# Patient Record
Sex: Female | Born: 1950 | Race: White | Hispanic: No
Health system: Southern US, Community
[De-identification: ages and names within clinical notes are randomized; demographics above are authoritative.]

---

## 1999-12-05 ENCOUNTER — Other Ambulatory Visit: Admission: RE | Admit: 1999-12-05 | Discharge: 1999-12-05 | Payer: Self-pay | Admitting: Gynecology

## 2000-02-28 ENCOUNTER — Other Ambulatory Visit: Admission: RE | Admit: 2000-02-28 | Discharge: 2000-02-28 | Payer: Self-pay | Admitting: Gynecology

## 2000-03-27 ENCOUNTER — Other Ambulatory Visit: Admission: RE | Admit: 2000-03-27 | Discharge: 2000-03-27 | Payer: Self-pay | Admitting: Gynecology

## 2000-08-29 ENCOUNTER — Ambulatory Visit (HOSPITAL_COMMUNITY): Admission: RE | Admit: 2000-08-29 | Discharge: 2000-08-29 | Payer: Self-pay | Admitting: Gastroenterology

## 2000-10-23 ENCOUNTER — Encounter: Admission: RE | Admit: 2000-10-23 | Discharge: 2000-10-23 | Payer: Self-pay | Admitting: Gynecology

## 2000-10-23 ENCOUNTER — Encounter: Payer: Self-pay | Admitting: Gynecology

## 2001-01-07 ENCOUNTER — Other Ambulatory Visit: Admission: RE | Admit: 2001-01-07 | Discharge: 2001-01-07 | Payer: Self-pay | Admitting: Gynecology

## 2001-01-15 ENCOUNTER — Encounter: Payer: Self-pay | Admitting: Gynecology

## 2001-01-15 ENCOUNTER — Encounter: Admission: RE | Admit: 2001-01-15 | Discharge: 2001-01-15 | Payer: Self-pay | Admitting: Gynecology

## 2007-07-23 ENCOUNTER — Other Ambulatory Visit: Payer: Self-pay

## 2007-07-23 ENCOUNTER — Inpatient Hospital Stay: Payer: Self-pay | Admitting: Internal Medicine

## 2007-09-23 ENCOUNTER — Emergency Department: Payer: Self-pay | Admitting: Emergency Medicine

## 2007-09-23 ENCOUNTER — Other Ambulatory Visit: Payer: Self-pay

## 2007-12-17 ENCOUNTER — Emergency Department: Payer: Self-pay | Admitting: Emergency Medicine

## 2008-02-10 ENCOUNTER — Inpatient Hospital Stay: Payer: Self-pay | Admitting: Internal Medicine

## 2008-02-10 ENCOUNTER — Other Ambulatory Visit: Payer: Self-pay

## 2008-04-10 ENCOUNTER — Other Ambulatory Visit: Payer: Self-pay

## 2008-04-10 ENCOUNTER — Emergency Department: Payer: Self-pay | Admitting: Emergency Medicine

## 2008-04-30 ENCOUNTER — Other Ambulatory Visit: Payer: Self-pay

## 2008-04-30 ENCOUNTER — Emergency Department: Payer: Self-pay | Admitting: Emergency Medicine

## 2008-05-30 ENCOUNTER — Other Ambulatory Visit: Payer: Self-pay

## 2008-05-30 ENCOUNTER — Inpatient Hospital Stay: Payer: Self-pay | Admitting: Internal Medicine

## 2008-07-20 ENCOUNTER — Emergency Department: Payer: Self-pay | Admitting: Emergency Medicine

## 2008-08-13 ENCOUNTER — Inpatient Hospital Stay: Payer: Self-pay | Admitting: Internal Medicine

## 2010-03-12 ENCOUNTER — Inpatient Hospital Stay: Payer: Self-pay | Admitting: Internal Medicine

## 2010-07-05 ENCOUNTER — Inpatient Hospital Stay: Payer: Self-pay | Admitting: Specialist

## 2010-07-16 ENCOUNTER — Emergency Department: Payer: Self-pay | Admitting: Emergency Medicine

## 2011-02-11 ENCOUNTER — Inpatient Hospital Stay: Payer: Self-pay | Admitting: Internal Medicine

## 2011-04-25 ENCOUNTER — Emergency Department: Payer: Self-pay | Admitting: Emergency Medicine

## 2011-04-26 ENCOUNTER — Inpatient Hospital Stay (HOSPITAL_COMMUNITY): Payer: Non-veteran care

## 2011-04-26 ENCOUNTER — Inpatient Hospital Stay (HOSPITAL_COMMUNITY)
Admission: AD | Admit: 2011-04-26 | Discharge: 2011-05-02 | DRG: 917 | Disposition: A | Discharge status: Home / Self Care | Payer: Non-veteran care | Source: Other Acute Inpatient Hospital | Attending: Internal Medicine | Admitting: Internal Medicine

## 2011-04-26 DIAGNOSIS — J9600 Acute respiratory failure, unspecified whether with hypoxia or hypercapnia: Secondary | ICD-10-CM

## 2011-04-26 DIAGNOSIS — T4271XA Poisoning by unspecified antiepileptic and sedative-hypnotic drugs, accidental (unintentional), initial encounter: Principal | ICD-10-CM | POA: Diagnosis present

## 2011-04-26 DIAGNOSIS — Z8673 Personal history of transient ischemic attack (TIA), and cerebral infarction without residual deficits: Secondary | ICD-10-CM

## 2011-04-26 DIAGNOSIS — Z7982 Long term (current) use of aspirin: Secondary | ICD-10-CM

## 2011-04-26 DIAGNOSIS — F1120 Opioid dependence, uncomplicated: Secondary | ICD-10-CM | POA: Diagnosis present

## 2011-04-26 DIAGNOSIS — J449 Chronic obstructive pulmonary disease, unspecified: Secondary | ICD-10-CM

## 2011-04-26 DIAGNOSIS — Z9861 Coronary angioplasty status: Secondary | ICD-10-CM

## 2011-04-26 DIAGNOSIS — E876 Hypokalemia: Secondary | ICD-10-CM | POA: Diagnosis present

## 2011-04-26 DIAGNOSIS — T424X4A Poisoning by benzodiazepines, undetermined, initial encounter: Secondary | ICD-10-CM | POA: Diagnosis present

## 2011-04-26 DIAGNOSIS — G9340 Encephalopathy, unspecified: Secondary | ICD-10-CM | POA: Diagnosis present

## 2011-04-26 DIAGNOSIS — J4489 Other specified chronic obstructive pulmonary disease: Secondary | ICD-10-CM | POA: Diagnosis present

## 2011-04-26 DIAGNOSIS — I10 Essential (primary) hypertension: Secondary | ICD-10-CM | POA: Diagnosis present

## 2011-04-26 DIAGNOSIS — J189 Pneumonia, unspecified organism: Secondary | ICD-10-CM | POA: Diagnosis present

## 2011-04-26 DIAGNOSIS — F1910 Other psychoactive substance abuse, uncomplicated: Secondary | ICD-10-CM | POA: Diagnosis present

## 2011-04-26 DIAGNOSIS — I2510 Atherosclerotic heart disease of native coronary artery without angina pectoris: Secondary | ICD-10-CM | POA: Diagnosis present

## 2011-04-26 DIAGNOSIS — E785 Hyperlipidemia, unspecified: Secondary | ICD-10-CM | POA: Diagnosis present

## 2011-04-26 DIAGNOSIS — T400X1A Poisoning by opium, accidental (unintentional), initial encounter: Secondary | ICD-10-CM

## 2011-04-26 DIAGNOSIS — E119 Type 2 diabetes mellitus without complications: Secondary | ICD-10-CM | POA: Diagnosis present

## 2011-04-26 DIAGNOSIS — T424X1A Poisoning by benzodiazepines, accidental (unintentional), initial encounter: Secondary | ICD-10-CM | POA: Diagnosis present

## 2011-04-26 DIAGNOSIS — G4700 Insomnia, unspecified: Secondary | ICD-10-CM | POA: Diagnosis present

## 2011-04-26 LAB — POCT I-STAT 3, ART BLOOD GAS (G3+)
Acid-Base Excess: 8 mmol/L — ABNORMAL HIGH (ref 0.0–2.0)
Bicarbonate: 34.5 mEq/L — ABNORMAL HIGH (ref 20.0–24.0)
O2 Saturation: 92 %
Patient temperature: 97.4
TCO2: 36 mmol/L (ref 0–100)
pCO2 arterial: 56 mmHg — ABNORMAL HIGH (ref 35.0–45.0)
pH, Arterial: 7.394 (ref 7.350–7.400)
pO2, Arterial: 64 mmHg — ABNORMAL LOW (ref 80.0–100.0)

## 2011-04-26 LAB — GLUCOSE, CAPILLARY
Glucose-Capillary: 121 mg/dL — ABNORMAL HIGH (ref 70–99)
Glucose-Capillary: 124 mg/dL — ABNORMAL HIGH (ref 70–99)
Glucose-Capillary: 120 mg/dL — ABNORMAL HIGH (ref 70–99)

## 2011-04-26 LAB — BASIC METABOLIC PANEL
GFR calc non Af Amer: >60 mL/min (ref >60)
Glucose, Bld: 127 mg/dL — ABNORMAL HIGH (ref 70–99)
Potassium: 2.9 mEq/L — ABNORMAL LOW (ref 3.5–5.1)
Sodium: 139 mEq/L (ref 135–145)

## 2011-04-26 LAB — MRSA PCR SCREENING: MRSA by PCR: NEGATIVE

## 2011-04-27 ENCOUNTER — Inpatient Hospital Stay (HOSPITAL_COMMUNITY): Payer: Non-veteran care

## 2011-04-27 LAB — CBC
Hemoglobin: 11.4 g/dL — ABNORMAL LOW (ref 12.0–15.0)
MCH: 28.5 pg (ref 26.0–34.0)
Platelets: 133 10*3/uL — ABNORMAL LOW (ref 150–400)
RBC: 4 MIL/uL (ref 3.87–5.11)
WBC: 8.1 10*3/uL (ref 4.0–10.5)

## 2011-04-27 LAB — BASIC METABOLIC PANEL
CO2: 32 mEq/L (ref 19–32)
Chloride: 102 mEq/L (ref 96–112)
Glucose, Bld: 131 mg/dL — ABNORMAL HIGH (ref 70–99)
Potassium: 3.8 mEq/L (ref 3.5–5.1)
Sodium: 139 mEq/L (ref 135–145)

## 2011-04-27 LAB — GLUCOSE, CAPILLARY
Glucose-Capillary: 136 mg/dL — ABNORMAL HIGH (ref 70–99)
Glucose-Capillary: 96 mg/dL (ref 70–99)

## 2011-04-27 LAB — MAGNESIUM: Magnesium: 1.6 mg/dL (ref 1.5–2.5)

## 2011-04-27 LAB — PHOSPHORUS: Phosphorus: 2.2 mg/dL — ABNORMAL LOW (ref 2.3–4.6)

## 2011-04-28 ENCOUNTER — Inpatient Hospital Stay (HOSPITAL_COMMUNITY): Payer: Non-veteran care

## 2011-04-28 LAB — CBC
HCT: 34.5 % — ABNORMAL LOW (ref 36.0–46.0)
Hemoglobin: 11.3 g/dL — ABNORMAL LOW (ref 12.0–15.0)
MCH: 28.7 pg (ref 26.0–34.0)
MCHC: 32.8 g/dL (ref 30.0–36.0)
RBC: 3.94 MIL/uL (ref 3.87–5.11)

## 2011-04-28 LAB — BASIC METABOLIC PANEL
BUN: 5 mg/dL — ABNORMAL LOW (ref 6–23)
CO2: 35 mEq/L — ABNORMAL HIGH (ref 19–32)
Calcium: 8.5 mg/dL (ref 8.4–10.5)
GFR calc non Af Amer: >60 mL/min (ref >60)
Glucose, Bld: 111 mg/dL — ABNORMAL HIGH (ref 70–99)
Potassium: 3.1 mEq/L — ABNORMAL LOW (ref 3.5–5.1)
Sodium: 140 mEq/L (ref 135–145)

## 2011-04-28 LAB — GLUCOSE, CAPILLARY: Glucose-Capillary: 111 mg/dL — ABNORMAL HIGH (ref 70–99)

## 2011-04-28 LAB — LACTIC ACID, PLASMA: Lactic Acid, Venous: 1.4 mmol/L (ref 0.5–2.2)

## 2011-04-29 LAB — GLUCOSE, CAPILLARY
Glucose-Capillary: 125 mg/dL — ABNORMAL HIGH (ref 70–99)
Glucose-Capillary: 88 mg/dL (ref 70–99)

## 2011-04-29 LAB — CBC
MCV: 89 fL (ref 78.0–100.0)
Platelets: 144 10*3/uL — ABNORMAL LOW (ref 150–400)
RBC: 3.83 MIL/uL — ABNORMAL LOW (ref 3.87–5.11)
RDW: 14.7 % (ref 11.5–15.5)
WBC: 5 10*3/uL (ref 4.0–10.5)

## 2011-04-29 LAB — PHOSPHORUS: Phosphorus: 2.7 mg/dL (ref 2.3–4.6)

## 2011-04-29 LAB — BASIC METABOLIC PANEL
CO2: 36 mEq/L — ABNORMAL HIGH (ref 19–32)
Chloride: 99 mEq/L (ref 96–112)
Creatinine, Ser: 0.6 mg/dL (ref 0.50–1.10)
GFR calc Af Amer: >60 mL/min (ref >60)
Potassium: 3.4 mEq/L — ABNORMAL LOW (ref 3.5–5.1)

## 2011-04-30 DIAGNOSIS — F322 Major depressive disorder, single episode, severe without psychotic features: Secondary | ICD-10-CM

## 2011-04-30 LAB — BASIC METABOLIC PANEL
BUN: 8 mg/dL (ref 6–23)
CO2: 38 mEq/L — ABNORMAL HIGH (ref 19–32)
Calcium: 9.1 mg/dL (ref 8.4–10.5)
Creatinine, Ser: 0.72 mg/dL (ref 0.50–1.10)
GFR calc non Af Amer: >60 mL/min (ref >60)
Glucose, Bld: 85 mg/dL (ref 70–99)
Sodium: 141 mEq/L (ref 135–145)

## 2011-04-30 LAB — CBC
HCT: 34.3 % — ABNORMAL LOW (ref 36.0–46.0)
Hemoglobin: 11.1 g/dL — ABNORMAL LOW (ref 12.0–15.0)
MCH: 28.8 pg (ref 26.0–34.0)
MCHC: 32.4 g/dL (ref 30.0–36.0)
MCV: 88.9 fL (ref 78.0–100.0)
RBC: 3.86 MIL/uL — ABNORMAL LOW (ref 3.87–5.11)

## 2011-05-01 LAB — CBC
MCHC: 32.6 g/dL (ref 30.0–36.0)
Platelets: 169 10*3/uL (ref 150–400)
RDW: 14.5 % (ref 11.5–15.5)
WBC: 5.3 10*3/uL (ref 4.0–10.5)

## 2011-05-01 LAB — GLUCOSE, CAPILLARY
Glucose-Capillary: 166 mg/dL — ABNORMAL HIGH (ref 70–99)
Glucose-Capillary: 108 mg/dL — ABNORMAL HIGH (ref 70–99)
Glucose-Capillary: 109 mg/dL — ABNORMAL HIGH (ref 70–99)

## 2011-05-01 LAB — BASIC METABOLIC PANEL
BUN: 16 mg/dL (ref 6–23)
Chloride: 98 mEq/L (ref 96–112)
GFR calc Af Amer: >60 mL/min (ref >60)
GFR calc non Af Amer: 60 mL/min — ABNORMAL LOW (ref >60)
Potassium: 3.7 mEq/L (ref 3.5–5.1)

## 2011-05-01 LAB — MAGNESIUM: Magnesium: 1.7 mg/dL (ref 1.5–2.5)

## 2011-05-03 NOTE — Discharge Summary (Signed)
  [name], [name]                ACCOUNT NO.:  [id number]  MEDICAL RECORD NO.:  [id number]  LOCATION:  3712                         FACILITY:  [facility]  PHYSICIAN:  Conley Canal, MD      DATE OF BIRTH:  02-27-1951  DATE OF ADMISSION:  04/26/2011 DATE OF DISCHARGE:  05/02/2011                        DISCHARGE SUMMARY - REFERRING   PRIMARY CARE PHYSICIAN:  [place].  CONSULTING PHYSICIANS: 1. Pulmonary and critical care. 2. Dr. Eulogio Ditch, psychiatrist.  DISCHARGE DIAGNOSES: 1. Acute on chronic respiratory failure in patient with chronic     obstructive pulmonary disease requiring mechanical ventilation,     acute respiratory failure secondary to overdose of narcotics and     benzodiazepines. 2. Malignant hypertension. 3. Hyperlipidemia. 4. Left lower lobe pneumonia. 5. coronary artery disease history, unclear.  DISCHARGE MEDICATIONS: 1. Amlodipine 5 mg daily. 2. Advair 250/50 one puffs twice daily. 3. Robitussin 1200 mg twice daily. 4. Prednisone taper. 5. Combivent inhalations every 4 hours as needed. 6. Aspirin 81 mg daily. 7. Colace 100 mg twice daily. 8. Oxycodone 5 mg every 6 hours as needed.  No new prescription given. 9. Temazepam 30 mg at bedtime.  No new prescription given. 10.Triamterene HCTZ 37.5- 25 mg daily.  PROCEDURES PERFORMED:  Chest x-rays on August 1, 2, and 3 showing emphysematous and bronchitic changes with evidence of old granulomatous disease, minimal atelectasis versus infiltrate at left lung base.  HOSPITAL COURSE:  Ms. [name] was admitted on August 1 as a transfer from [facility] with acute respiratory failure, presumed to be secondary to narcotic overdose.  The patient apparently admitted to taking a handful of pills before becoming unresponsive and hypotensive and at admission the patient was found to have pinpoint pupils.  She was intubated for airway protection and required a Narcan drip.  The patient was initially  managed under the critical care service eventually, successfully extubated and she has been doing well since coming off the ventilator.  The patient was seen by Psychiatry regarding the overdose and psychiatry felt that the patient was not suicidal.  The patient has been on narcotics for a long time and she is now dependent and cannot do without the narcotics.  Different options were offered by Psychiatry including detoxification, which the patient was not interested in.  I did not give a new prescription during this admission.  She has done well.  The patient completed course of antibiotics with Avelox for COPD and left lower lobe pneumonia.  Today her labs include a CBC showing white count of 5.3, hemoglobin 11.6, hematocrit 35.6, platelet count 169, sodium 141, potassium 3.7, BUN 16, creatinine 0.96, bicarb 37, calcium 9.4, magnesium 1.7.  She should follow with [facility].  She is being discharged to the care of supportive family including her mother.     Conley Canal, MD     SR/MEDQ  D:  05/02/2011  T:  05/02/2011  Job:  [id number]  Electronically Signed by Conley Canal  on 05/03/2011 10:12:02 PM

## 2011-05-04 NOTE — Consult Note (Signed)
[name], [name]                ACCOUNT NO.:  [id number]  MEDICAL RECORD NO.:  [id number]  LOCATION:  2112                         FACILITY:  [facility]  PHYSICIAN:  Eulogio Ditch, MD DATE OF BIRTH:  [birth year]  DATE OF CONSULTATION:  04/26/2011 DATE OF DISCHARGE:                                CONSULTATION   REASON FOR CONSULTATION:  Questionable overdose on the pain medications.  HISTORY OF PRESENT ILLNESS:  A 60 year old Caucasian female who is admitted in the ICU at [facility] after the narcotic overdose.  The patient told me that she takes oxycodone 2 tablets 5 mg he q.4 h. and she take one sleeping pill temazepam at bedtime and her mother controls that.  She denied that she was trying to kill herself.  She denied any past suicide attempt or being admitted in Psychiatry.  The patient is not followed by psychiatrist and she denied seen by a psychiatrist in the past.  The patient told me that she takes pain medication for arthritis and she go to the [facility].  I spoke with the son who told me that the mother has went through a lot of bad relationships and she was introduced on the pain medication by her friend last year and after that she started using pain medication and he thinks the mother is overusing this medication and she goes to the doctor and lies about her pain to get more pain medication, but he denies that the mother was trying to kill herself.  He denied that any time recently that she verbalized any suicidal ideations.  The patient is calm and cooperative during the interview.  Fair eye contact.  No abnormal movements noticed.  The patient is logical and goal directed.  Not delusional, not psychotic, not internally preoccupied.  The patient is alert, awake, oriented x3.  Memory immediate, recent remote fair.  Attention and concentration fair. Abstraction ability fair.  Insight and judgment fair.  DIAGNOSES:  AXIS I:  Opioids dependence. AXIS II:   Deferred. AXIS III:  See medical notes. AXIS IV:  Questionable substance abuse. AXIS V:  50.  RECOMMENDATIONS: 1. I discussed with the patient different options one of them was to     get detox on the pain medication, but the patient told me that she     needs to be on pain medication and she do not want detox at this     time. 2. I told her that the son was talking about her going through bad     relationship so she will get benefit from counseling.  She agreed     to followup at [facility] in the outpatient setting for the     counseling.  I told the son that the clinical social worker will     call [facility] to make an outpatient psychiatric appointment. 3. At this time, sitter can be discontinued as the patient is not     suicidal. 4. I will follow up on this patient as needed.  Thanks for involving me in taking care of this patient.     Eulogio Ditch, MD     SA/MEDQ  D:  04/26/2011  T:  04/26/2011  Job:  [id number]  Electronically Signed by Eulogio Ditch  on 05/04/2011 08:55:45 AM

## 2011-11-24 ENCOUNTER — Inpatient Hospital Stay: Payer: Self-pay | Admitting: Internal Medicine

## 2011-11-24 LAB — COMPREHENSIVE METABOLIC PANEL
Alkaline Phosphatase: 68 U/L (ref 50–136)
Calcium, Total: 9.6 mg/dL (ref 8.5–10.1)
Co2: 35 mmol/L — ABNORMAL HIGH (ref 21–32)
Creatinine: 0.84 mg/dL (ref 0.60–1.30)
EGFR (Non-African Amer.): >60
SGPT (ALT): 15 U/L

## 2011-11-24 LAB — CBC
MCV: 87 fL (ref 80–100)
Platelet: 135 10*3/uL — ABNORMAL LOW (ref 150–440)
RBC: 5.23 10*6/uL — ABNORMAL HIGH (ref 3.80–5.20)
RDW: 13.5 % (ref 11.5–14.5)
WBC: 8.9 10*3/uL (ref 3.6–11.0)

## 2011-11-25 LAB — CBC WITH DIFFERENTIAL/PLATELET
Basophil %: 0 %
Eosinophil #: 0 10*3/uL (ref 0.0–0.7)
Eosinophil %: 0 %
HGB: 13.2 g/dL (ref 12.0–16.0)
Lymphocyte #: 0.2 10*3/uL — ABNORMAL LOW (ref 1.0–3.6)
MCH: 29.3 pg (ref 26.0–34.0)
MCHC: 33.6 g/dL (ref 32.0–36.0)
MCV: 87 fL (ref 80–100)
Monocyte #: 0.1 10*3/uL (ref 0.0–0.7)
RBC: 4.52 10*6/uL (ref 3.80–5.20)

## 2011-11-25 LAB — BASIC METABOLIC PANEL
Anion Gap: 14 (ref 7–16)
BUN: 22 mg/dL — ABNORMAL HIGH (ref 7–18)
Calcium, Total: 9.4 mg/dL (ref 8.5–10.1)
EGFR (African American): >60
EGFR (Non-African Amer.): 53 — ABNORMAL LOW
Glucose: 136 mg/dL — ABNORMAL HIGH (ref 65–99)
Osmolality: 277 (ref 275–301)
Potassium: 3.5 mmol/L (ref 3.5–5.1)

## 2011-11-29 LAB — CBC WITH DIFFERENTIAL/PLATELET
Basophil #: 0 10*3/uL (ref 0.0–0.1)
Eosinophil #: 0 10*3/uL (ref 0.0–0.7)
HGB: 12 g/dL (ref 12.0–16.0)
Lymphocyte #: 0.2 10*3/uL — ABNORMAL LOW (ref 1.0–3.6)
Lymphocyte %: 2.5 %
MCH: 29.1 pg (ref 26.0–34.0)
MCHC: 32.7 g/dL (ref 32.0–36.0)
MCV: 89 fL (ref 80–100)
Neutrophil #: 8.2 10*3/uL — ABNORMAL HIGH (ref 1.4–6.5)
RBC: 4.11 10*6/uL (ref 3.80–5.20)
RDW: 15.3 % — ABNORMAL HIGH (ref 11.5–14.5)
WBC: 8.9 10*3/uL (ref 3.6–11.0)

## 2011-11-29 LAB — BASIC METABOLIC PANEL
Anion Gap: 8 (ref 7–16)
BUN: 29 mg/dL — ABNORMAL HIGH (ref 7–18)
Chloride: 92 mmol/L — ABNORMAL LOW (ref 98–107)
Co2: 37 mmol/L — ABNORMAL HIGH (ref 21–32)
Creatinine: 1.01 mg/dL (ref 0.60–1.30)
Potassium: 4.4 mmol/L (ref 3.5–5.1)
Sodium: 137 mmol/L (ref 136–145)

## 2011-12-02 LAB — CBC WITH DIFFERENTIAL/PLATELET
Basophil %: 0.1 %
Eosinophil %: 0 %
HCT: 35.9 % (ref 35.0–47.0)
HGB: 12 g/dL (ref 12.0–16.0)
MCH: 29.9 pg (ref 26.0–34.0)
Monocyte #: 0.7 10*3/uL (ref 0.0–0.7)
Neutrophil #: 11.2 10*3/uL — ABNORMAL HIGH (ref 1.4–6.5)
Neutrophil %: 91.6 %
RBC: 4.02 10*6/uL (ref 3.80–5.20)

## 2011-12-02 LAB — BASIC METABOLIC PANEL
Anion Gap: 8 (ref 7–16)
BUN: 39 mg/dL — ABNORMAL HIGH (ref 7–18)
Chloride: 88 mmol/L — ABNORMAL LOW (ref 98–107)
Co2: 38 mmol/L — ABNORMAL HIGH (ref 21–32)
Creatinine: 0.95 mg/dL (ref 0.60–1.30)
EGFR (African American): >60
Osmolality: 285 (ref 275–301)

## 2011-12-04 LAB — BASIC METABOLIC PANEL
BUN: 35 mg/dL — ABNORMAL HIGH (ref 7–18)
Chloride: 89 mmol/L — ABNORMAL LOW (ref 98–107)
Creatinine: 0.74 mg/dL (ref 0.60–1.30)
EGFR (Non-African Amer.): >60
Glucose: 206 mg/dL — ABNORMAL HIGH (ref 65–99)
Potassium: 4.3 mmol/L (ref 3.5–5.1)
Sodium: 137 mmol/L (ref 136–145)

## 2011-12-04 LAB — WBC: WBC: 13.9 10*3/uL — ABNORMAL HIGH (ref 3.6–11.0)

## 2011-12-05 LAB — WBC: WBC: 18.5 10*3/uL — ABNORMAL HIGH (ref 3.6–11.0)

## 2011-12-06 LAB — WBC: WBC: 15.5 10*3/uL — ABNORMAL HIGH (ref 3.6–11.0)

## 2011-12-16 ENCOUNTER — Emergency Department: Payer: Self-pay | Admitting: Emergency Medicine

## 2011-12-16 LAB — COMPREHENSIVE METABOLIC PANEL
Albumin: 3 g/dL — ABNORMAL LOW (ref 3.4–5.0)
Alkaline Phosphatase: 72 U/L (ref 50–136)
Anion Gap: 9 (ref 7–16)
BUN: 20 mg/dL — ABNORMAL HIGH (ref 7–18)
Bilirubin,Total: 0.3 mg/dL (ref 0.2–1.0)
Chloride: 106 mmol/L (ref 98–107)
Creatinine: 0.54 mg/dL — ABNORMAL LOW (ref 0.60–1.30)
EGFR (African American): >60
Potassium: 3.9 mmol/L (ref 3.5–5.1)
Total Protein: 6.9 g/dL (ref 6.4–8.2)

## 2011-12-16 LAB — DRUG SCREEN, URINE
Amphetamines, Ur Screen: NEGATIVE (ref ?–1000)
Barbiturates, Ur Screen: NEGATIVE (ref ?–200)
Benzodiazepine, Ur Scrn: POSITIVE (ref ?–200)
Cannabinoid 50 Ng, Ur ~~LOC~~: NEGATIVE (ref ?–50)
MDMA (Ecstasy)Ur Screen: NEGATIVE (ref ?–500)
Methadone, Ur Screen: NEGATIVE (ref ?–300)
Opiate, Ur Screen: NEGATIVE (ref ?–300)
Phencyclidine (PCP) Ur S: NEGATIVE (ref ?–25)

## 2011-12-16 LAB — URINALYSIS, COMPLETE
Bilirubin,UR: NEGATIVE
Blood: NEGATIVE
Ketone: NEGATIVE
Nitrite: NEGATIVE
Protein: NEGATIVE
RBC,UR: 2 /HPF (ref 0–5)
Squamous Epithelial: <1
WBC UR: 6 /HPF (ref 0–5)

## 2011-12-16 LAB — ETHANOL
Ethanol %: <0.003 % (ref 0.000–0.080)
Ethanol: <3 mg/dL

## 2011-12-16 LAB — CBC
HCT: 32.3 % — ABNORMAL LOW (ref 35.0–47.0)
HGB: 10.8 g/dL — ABNORMAL LOW (ref 12.0–16.0)
MCHC: 33.3 g/dL (ref 32.0–36.0)
MCV: 89 fL (ref 80–100)
Platelet: 148 10*3/uL — ABNORMAL LOW (ref 150–440)
RBC: 3.63 10*6/uL — ABNORMAL LOW (ref 3.80–5.20)
RDW: 15.7 % — ABNORMAL HIGH (ref 11.5–14.5)
WBC: 3.7 10*3/uL (ref 3.6–11.0)

## 2011-12-16 LAB — SALICYLATE LEVEL: Salicylates, Serum: <1.7 mg/dL

## 2011-12-25 ENCOUNTER — Ambulatory Visit: Payer: Self-pay | Admitting: Internal Medicine

## 2012-01-20 LAB — CBC WITH DIFFERENTIAL/PLATELET
Eosinophil %: 5.2 %
HCT: 43.5 % (ref 35.0–47.0)
HGB: 14.8 g/dL (ref 12.0–16.0)
Lymphocyte #: 2.5 10*3/uL (ref 1.0–3.6)
MCH: 29.9 pg (ref 26.0–34.0)
Monocyte #: 0.7 x10 3/mm (ref 0.2–0.9)
Monocyte %: 8.4 %
Neutrophil #: 4.8 10*3/uL (ref 1.4–6.5)
Neutrophil %: 55.2 %
RDW: 14.8 % — ABNORMAL HIGH (ref 11.5–14.5)
WBC: 8.7 10*3/uL (ref 3.6–11.0)

## 2012-01-20 LAB — COMPREHENSIVE METABOLIC PANEL
Albumin: 4.2 g/dL (ref 3.4–5.0)
Anion Gap: 6 — ABNORMAL LOW (ref 7–16)
BUN: 12 mg/dL (ref 7–18)
Calcium, Total: 9.5 mg/dL (ref 8.5–10.1)
Co2: 33 mmol/L — ABNORMAL HIGH (ref 21–32)
EGFR (Non-African Amer.): >60
Glucose: 115 mg/dL — ABNORMAL HIGH (ref 65–99)
Osmolality: 273 (ref 275–301)
Potassium: 3.2 mmol/L — ABNORMAL LOW (ref 3.5–5.1)
SGOT(AST): 28 U/L (ref 15–37)
SGPT (ALT): 21 U/L
Total Protein: 7.8 g/dL (ref 6.4–8.2)

## 2012-01-20 LAB — TROPONIN I: Troponin-I: <0.02 ng/mL

## 2012-01-21 ENCOUNTER — Inpatient Hospital Stay: Payer: Self-pay | Admitting: Internal Medicine

## 2012-01-21 LAB — TROPONIN I: Troponin-I: <0.02 ng/mL

## 2012-01-21 LAB — CK TOTAL AND CKMB (NOT AT ARMC)
CK, Total: 48 U/L (ref 21–215)
CK, Total: 131 U/L (ref 21–215)
CK-MB: 2.9 ng/mL (ref 0.5–3.6)
CK-MB: 5.5 ng/mL — ABNORMAL HIGH (ref 0.5–3.6)

## 2012-01-21 LAB — MAGNESIUM: Magnesium: 1.6 mg/dL — ABNORMAL LOW

## 2012-01-21 LAB — POTASSIUM: Potassium: 3.9 mmol/L (ref 3.5–5.1)

## 2012-01-22 LAB — BASIC METABOLIC PANEL
Anion Gap: 9 (ref 7–16)
Calcium, Total: 8.9 mg/dL (ref 8.5–10.1)
Co2: 30 mmol/L (ref 21–32)
Creatinine: 0.93 mg/dL (ref 0.60–1.30)
EGFR (Non-African Amer.): >60
Osmolality: 284 (ref 275–301)
Potassium: 3.9 mmol/L (ref 3.5–5.1)

## 2012-01-22 LAB — DRUG SCREEN, URINE
Amphetamines, Ur Screen: NEGATIVE (ref ?–1000)
Barbiturates, Ur Screen: NEGATIVE (ref ?–200)
Benzodiazepine, Ur Scrn: POSITIVE (ref ?–200)
MDMA (Ecstasy)Ur Screen: NEGATIVE (ref ?–500)
Methadone, Ur Screen: NEGATIVE (ref ?–300)
Phencyclidine (PCP) Ur S: NEGATIVE (ref ?–25)

## 2012-01-22 LAB — CBC WITH DIFFERENTIAL/PLATELET
Basophil #: 0 10*3/uL (ref 0.0–0.1)
Basophil %: 0.1 %
Eosinophil #: 0 10*3/uL (ref 0.0–0.7)
Lymphocyte %: 2.3 %
MCH: 30.1 pg (ref 26.0–34.0)
MCHC: 33.6 g/dL (ref 32.0–36.0)
MCV: 90 fL (ref 80–100)
Monocyte #: 0.4 x10 3/mm (ref 0.2–0.9)
Monocyte %: 2.3 %
Neutrophil #: 15 10*3/uL — ABNORMAL HIGH (ref 1.4–6.5)
Platelet: 208 10*3/uL (ref 150–440)
WBC: 15.7 10*3/uL — ABNORMAL HIGH (ref 3.6–11.0)

## 2012-01-23 LAB — CBC WITH DIFFERENTIAL/PLATELET
Basophil #: 0 10*3/uL (ref 0.0–0.1)
Eosinophil #: 0 10*3/uL (ref 0.0–0.7)
HGB: 10.6 g/dL — ABNORMAL LOW (ref 12.0–16.0)
Lymphocyte %: 2.2 %
MCH: 30 pg (ref 26.0–34.0)
MCHC: 33.6 g/dL (ref 32.0–36.0)
MCV: 89 fL (ref 80–100)
Monocyte #: 0.6 x10 3/mm (ref 0.2–0.9)
Monocyte %: 3.8 %
Neutrophil #: 14.7 10*3/uL — ABNORMAL HIGH (ref 1.4–6.5)
Platelet: 199 10*3/uL (ref 150–440)
RDW: 14.8 % — ABNORMAL HIGH (ref 11.5–14.5)

## 2012-01-24 ENCOUNTER — Ambulatory Visit: Payer: Self-pay | Admitting: Internal Medicine

## 2012-01-27 LAB — CREATININE, SERUM: EGFR (Non-African Amer.): >60

## 2012-01-28 LAB — CREATININE, SERUM
Creatinine: 0.75 mg/dL (ref 0.60–1.30)
EGFR (African American): >60
EGFR (Non-African Amer.): >60

## 2012-02-24 ENCOUNTER — Ambulatory Visit: Payer: Self-pay | Admitting: Internal Medicine

## 2012-03-07 ENCOUNTER — Inpatient Hospital Stay: Payer: Self-pay | Admitting: Internal Medicine

## 2012-03-07 LAB — COMPREHENSIVE METABOLIC PANEL
Alkaline Phosphatase: 101 U/L (ref 50–136)
Anion Gap: 11 (ref 7–16)
BUN: 53 mg/dL — ABNORMAL HIGH (ref 7–18)
Bilirubin,Total: 0.5 mg/dL (ref 0.2–1.0)
Chloride: 91 mmol/L — ABNORMAL LOW (ref 98–107)
Co2: 28 mmol/L (ref 21–32)
EGFR (African American): 9 — ABNORMAL LOW
EGFR (Non-African Amer.): 8 — ABNORMAL LOW
Glucose: 110 mg/dL — ABNORMAL HIGH (ref 65–99)
Osmolality: 276 (ref 275–301)
SGOT(AST): 37 U/L (ref 15–37)
Sodium: 130 mmol/L — ABNORMAL LOW (ref 136–145)
Total Protein: 7.2 g/dL (ref 6.4–8.2)

## 2012-03-07 LAB — URINALYSIS, COMPLETE
Glucose,UR: NEGATIVE mg/dL (ref 0–75)
Ph: 5 (ref 4.5–8.0)
Protein: 30
RBC,UR: NONE SEEN /HPF (ref 0–5)
Specific Gravity: 1.015 (ref 1.003–1.030)

## 2012-03-07 LAB — TROPONIN I: Troponin-I: 0.17 ng/mL — ABNORMAL HIGH

## 2012-03-07 LAB — CBC
HCT: 36.8 % (ref 35.0–47.0)
HGB: 12.1 g/dL (ref 12.0–16.0)
MCHC: 32.8 g/dL (ref 32.0–36.0)
RBC: 4.33 10*6/uL (ref 3.80–5.20)
RDW: 15.4 % — ABNORMAL HIGH (ref 11.5–14.5)
WBC: 16.1 10*3/uL — ABNORMAL HIGH (ref 3.6–11.0)

## 2012-03-07 LAB — PROTIME-INR
INR: 0.9
Prothrombin Time: 12.3 secs (ref 11.5–14.7)

## 2012-03-07 LAB — APTT: Activated PTT: 27.9 secs (ref 23.6–35.9)

## 2012-03-08 LAB — BASIC METABOLIC PANEL
Anion Gap: 12 (ref 7–16)
Calcium, Total: 8 mg/dL — ABNORMAL LOW (ref 8.5–10.1)
Chloride: 97 mmol/L — ABNORMAL LOW (ref 98–107)
Creatinine: 3.78 mg/dL — ABNORMAL HIGH (ref 0.60–1.30)
EGFR (African American): 14 — ABNORMAL LOW
EGFR (Non-African Amer.): 12 — ABNORMAL LOW
Glucose: 124 mg/dL — ABNORMAL HIGH (ref 65–99)
Osmolality: 288 (ref 275–301)
Potassium: 4.1 mmol/L (ref 3.5–5.1)
Sodium: 135 mmol/L — ABNORMAL LOW (ref 136–145)

## 2012-03-08 LAB — CBC WITH DIFFERENTIAL/PLATELET
Basophil #: 0 10*3/uL (ref 0.0–0.1)
Basophil %: 0.2 %
Eosinophil %: 0 %
HCT: 34.5 % — ABNORMAL LOW (ref 35.0–47.0)
HGB: 11.2 g/dL — ABNORMAL LOW (ref 12.0–16.0)
Lymphocyte #: 0.4 10*3/uL — ABNORMAL LOW (ref 1.0–3.6)
Lymphocyte %: 2.9 %
MCHC: 32.5 g/dL (ref 32.0–36.0)
MCV: 86 fL (ref 80–100)
Monocyte #: 0.4 x10 3/mm (ref 0.2–0.9)
Monocyte %: 3.2 %
Neutrophil #: 11.3 10*3/uL — ABNORMAL HIGH (ref 1.4–6.5)
Platelet: 137 10*3/uL — ABNORMAL LOW (ref 150–440)
WBC: 12.1 10*3/uL — ABNORMAL HIGH (ref 3.6–11.0)

## 2012-03-08 LAB — RAPID URINE DRUG SCREEN, HOSP PERFORMED
Amphetamines, Ur Screen: NEGATIVE (ref ?–1000)
Barbiturates, Ur Screen: NEGATIVE (ref ?–200)
Benzodiazepine, Ur Scrn: POSITIVE (ref ?–200)
Opiate, Ur Screen: POSITIVE (ref ?–300)

## 2012-03-08 LAB — LIPID PANEL
Cholesterol: 197 mg/dL (ref 0–200)
Triglycerides: 94 mg/dL (ref 0–200)
VLDL Cholesterol, Calc: 19 mg/dL (ref 5–40)

## 2012-03-08 LAB — TROPONIN I: Troponin-I: 0.7 ng/mL — ABNORMAL HIGH

## 2012-03-13 LAB — CULTURE, BLOOD (SINGLE)

## 2012-06-14 ENCOUNTER — Inpatient Hospital Stay: Payer: Self-pay | Admitting: Internal Medicine

## 2012-06-14 LAB — CBC WITH DIFFERENTIAL/PLATELET
Basophil #: 0 10*3/uL (ref 0.0–0.1)
Eosinophil #: 0 10*3/uL (ref 0.0–0.7)
HCT: 30 % — ABNORMAL LOW (ref 35.0–47.0)
HGB: 10.2 g/dL — ABNORMAL LOW (ref 12.0–16.0)
Lymphocyte %: 2.9 %
MCHC: 33.9 g/dL (ref 32.0–36.0)
Monocyte %: 3.8 %
Neutrophil #: 4.8 10*3/uL (ref 1.4–6.5)
Neutrophil %: 93 %
RDW: 14.1 % (ref 11.5–14.5)
WBC: 5.2 10*3/uL (ref 3.6–11.0)

## 2012-06-14 LAB — COMPREHENSIVE METABOLIC PANEL
Albumin: 3.3 g/dL — ABNORMAL LOW (ref 3.4–5.0)
Anion Gap: 8 (ref 7–16)
BUN: 31 mg/dL — ABNORMAL HIGH (ref 7–18)
Creatinine: 0.82 mg/dL (ref 0.60–1.30)
EGFR (African American): >60
Glucose: 171 mg/dL — ABNORMAL HIGH (ref 65–99)
Osmolality: 288 (ref 275–301)
Potassium: 4.7 mmol/L (ref 3.5–5.1)
Sodium: 139 mmol/L (ref 136–145)
Total Protein: 6.6 g/dL (ref 6.4–8.2)

## 2012-06-14 LAB — CK TOTAL AND CKMB (NOT AT ARMC)
CK, Total: 50 U/L (ref 21–215)
CK-MB: 1.6 ng/mL (ref 0.5–3.6)

## 2012-06-15 DIAGNOSIS — I059 Rheumatic mitral valve disease, unspecified: Secondary | ICD-10-CM

## 2012-06-15 LAB — BASIC METABOLIC PANEL
Anion Gap: 7 (ref 7–16)
BUN: 35 mg/dL — ABNORMAL HIGH (ref 7–18)
Creatinine: 0.74 mg/dL (ref 0.60–1.30)
EGFR (African American): >60
EGFR (Non-African Amer.): >60

## 2012-06-15 LAB — LIPID PANEL
HDL Cholesterol: 51 mg/dL (ref 40–60)
Triglycerides: 88 mg/dL (ref 0–200)

## 2012-06-15 LAB — CBC WITH DIFFERENTIAL/PLATELET
Eosinophil #: 0 10*3/uL (ref 0.0–0.7)
Eosinophil %: 0 %
MCH: 29.6 pg (ref 26.0–34.0)
MCHC: 33.5 g/dL (ref 32.0–36.0)
Monocyte #: 0.3 x10 3/mm (ref 0.2–0.9)
Neutrophil #: 4.2 10*3/uL (ref 1.4–6.5)
Neutrophil %: 88.3 %
Platelet: 191 10*3/uL (ref 150–440)
RDW: 14 % (ref 11.5–14.5)

## 2012-06-15 LAB — CK TOTAL AND CKMB (NOT AT ARMC)
CK, Total: 41 U/L (ref 21–215)
CK-MB: 1.1 ng/mL (ref 0.5–3.6)

## 2012-06-15 LAB — TROPONIN I: Troponin-I: 0.1 ng/mL — ABNORMAL HIGH

## 2012-06-17 ENCOUNTER — Ambulatory Visit: Payer: Self-pay | Admitting: Internal Medicine

## 2012-06-19 LAB — CULTURE, BLOOD (SINGLE)

## 2012-06-25 ENCOUNTER — Ambulatory Visit: Payer: Self-pay | Admitting: Internal Medicine

## 2012-06-25 DEATH — deceased

## 2012-07-29 IMAGING — CR DG CHEST 1V PORT
1 series · 2 of 2 positions shown · non-contrast
Comparison: none

REASON FOR EXAM: post intubation
COMMENTS:

[Series 1: view not recorded · 0.17mm/px · 2 of 2 slices shown]
[im 1/2]
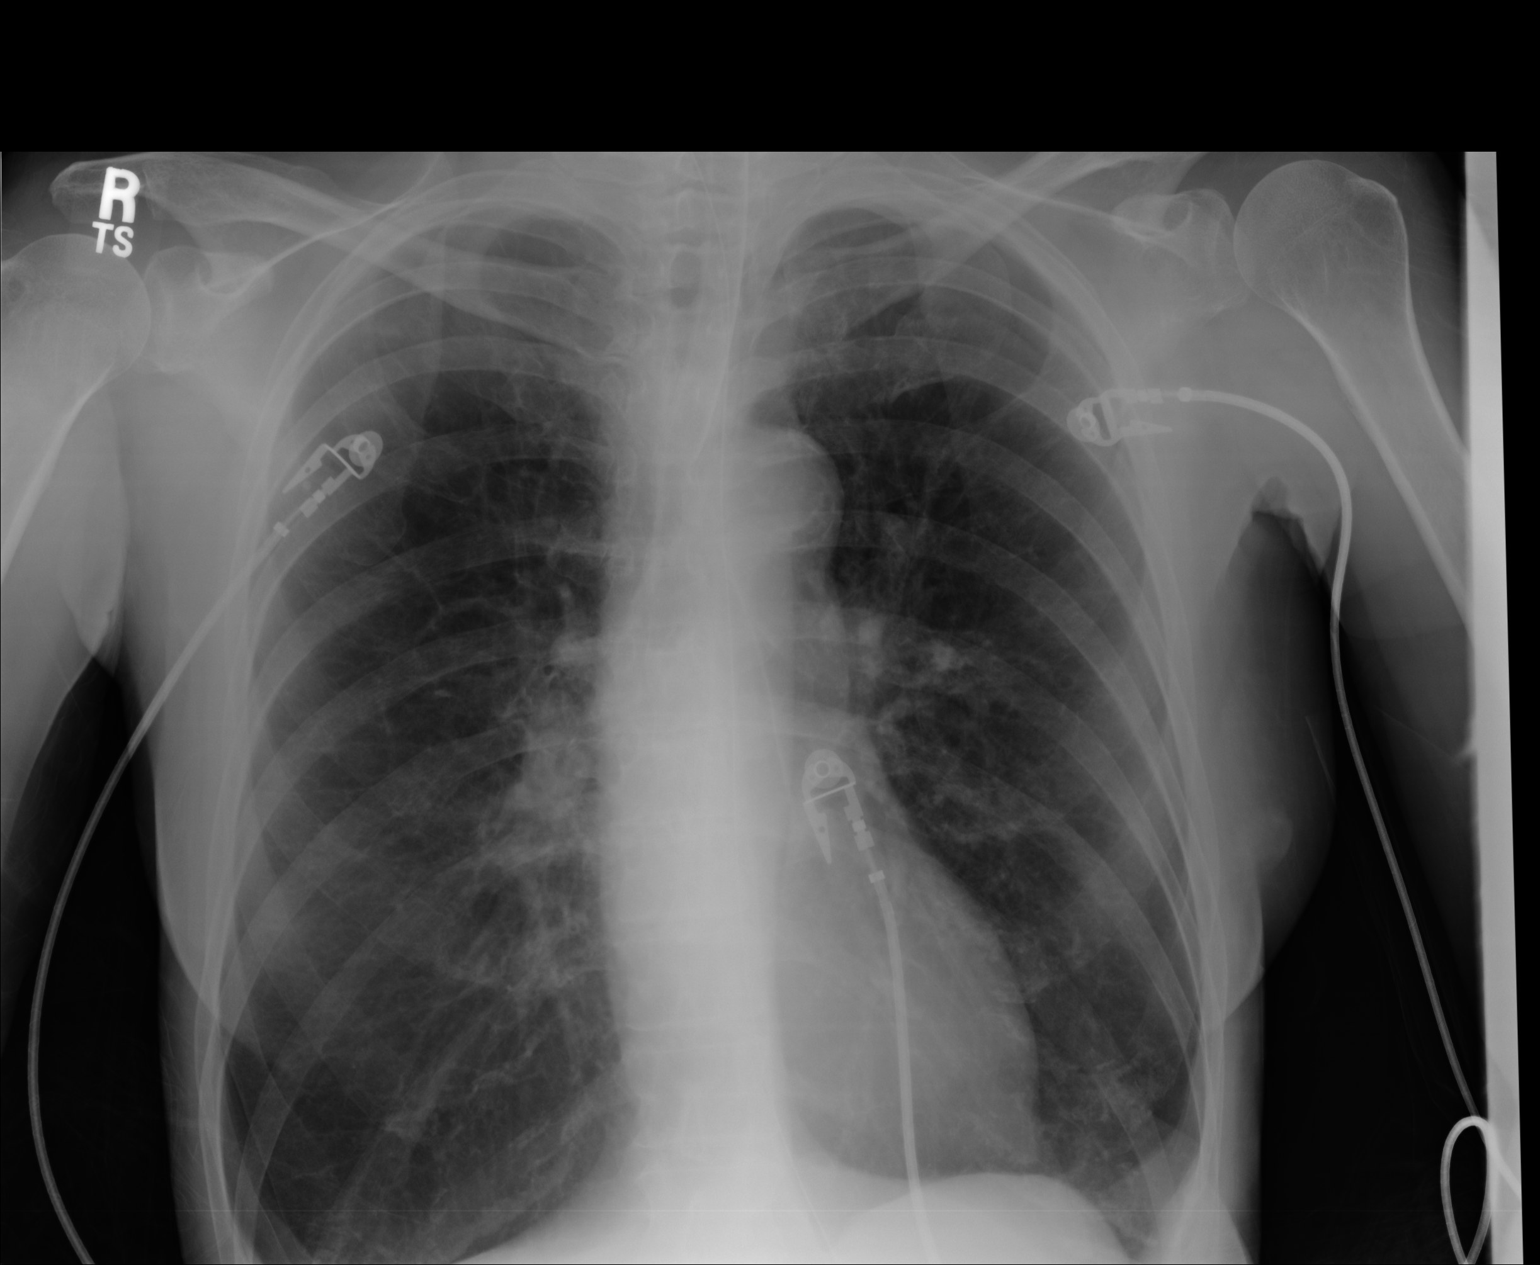
[im 2/2]
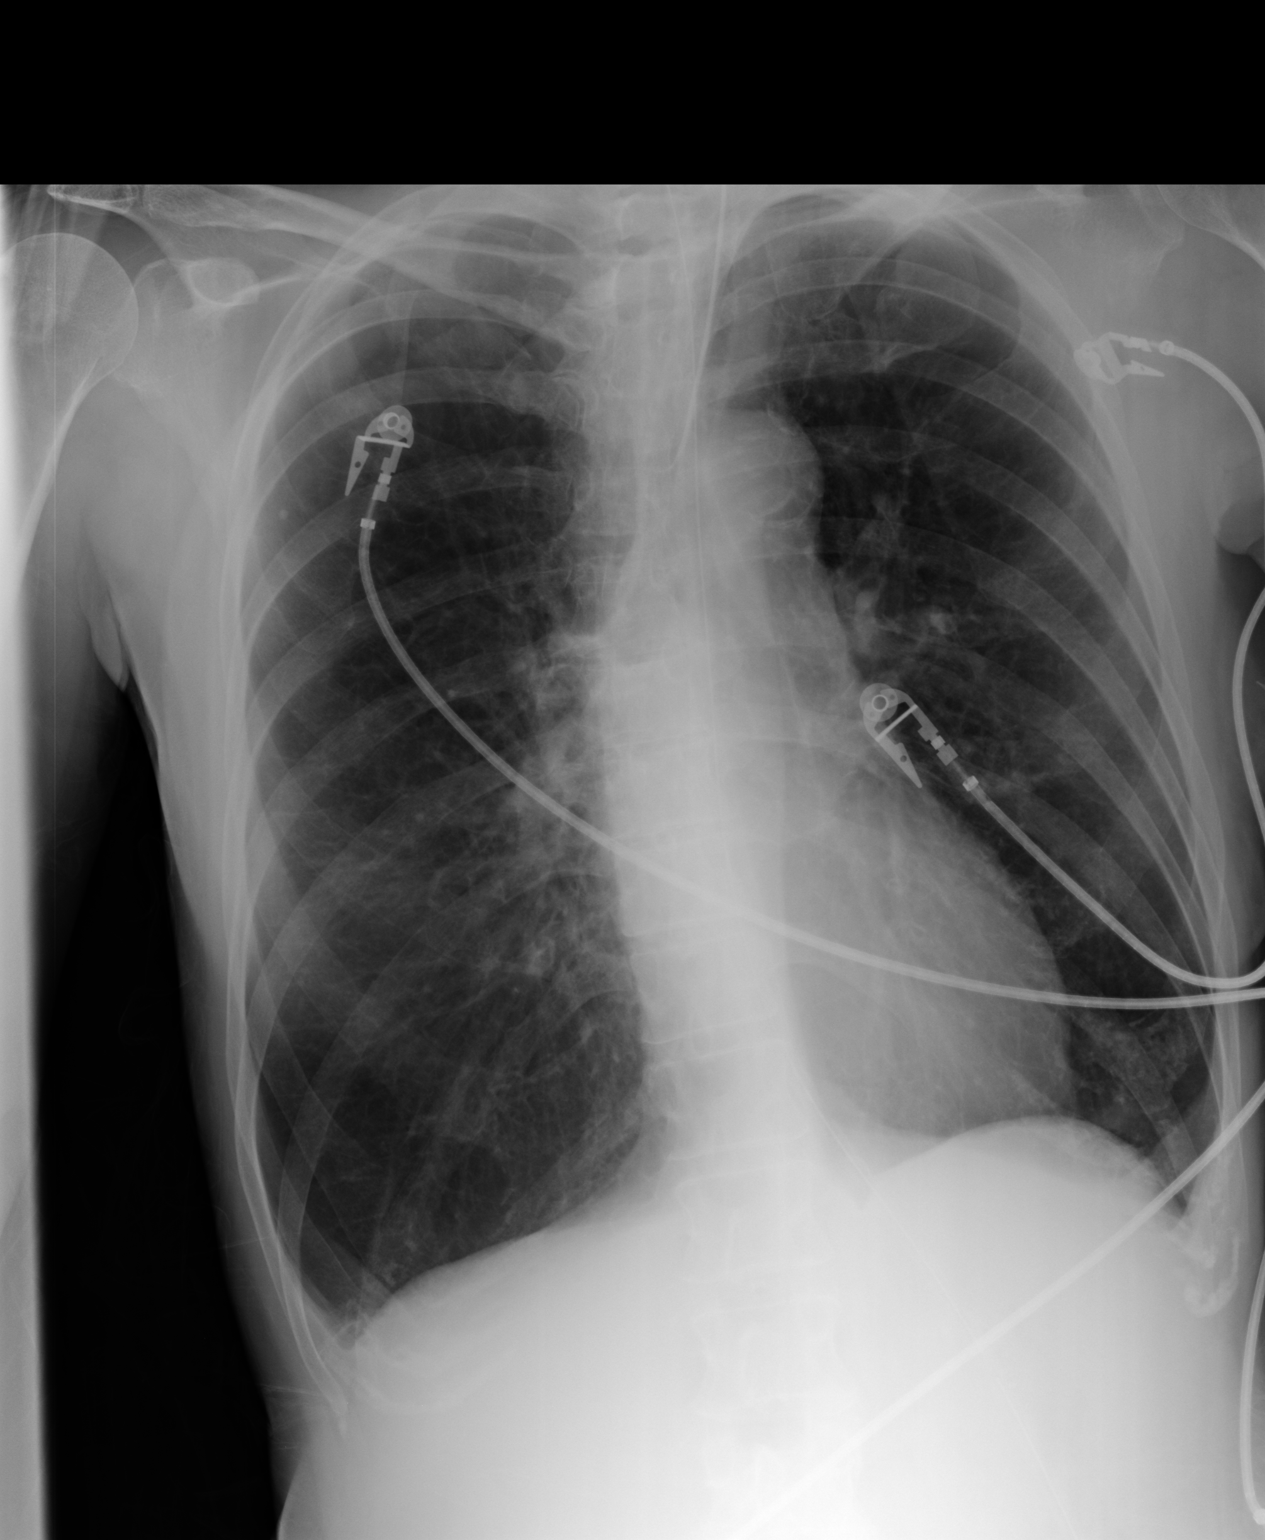

[2 of 2 positions shown; findings below may reference images not displayed]

PROCEDURE:     DXR - DXR PORTABLE CHEST SINGLE VIEW  - April 26, 2011  [DATE]

RESULT:     Comparison is made to the prior exam of earlier this date. There
is again noted hyperexpansion of the lungs consistent with COPD. No
pneumonia is identified. An endotracheal tube is now present with the tip in
good position approximately 4.3 cm above the carina. A nasogastric tube is
present with the tip visualized inferior to the left hemidiaphragm. No
pneumothorax is seen. Heart size is normal.
IMPRESSION: 1. The endotracheal tube tip is in good position approximately 4.3 cm above
the carina.
2. COPD.

## 2012-07-30 IMAGING — CR DG CHEST 1V PORT
1 series · 1 of 1 positions shown · non-contrast
Comparison: 04/26/2011

CLINICAL DATA: Short of breath

PORTABLE CHEST - 1 VIEW

[AP]
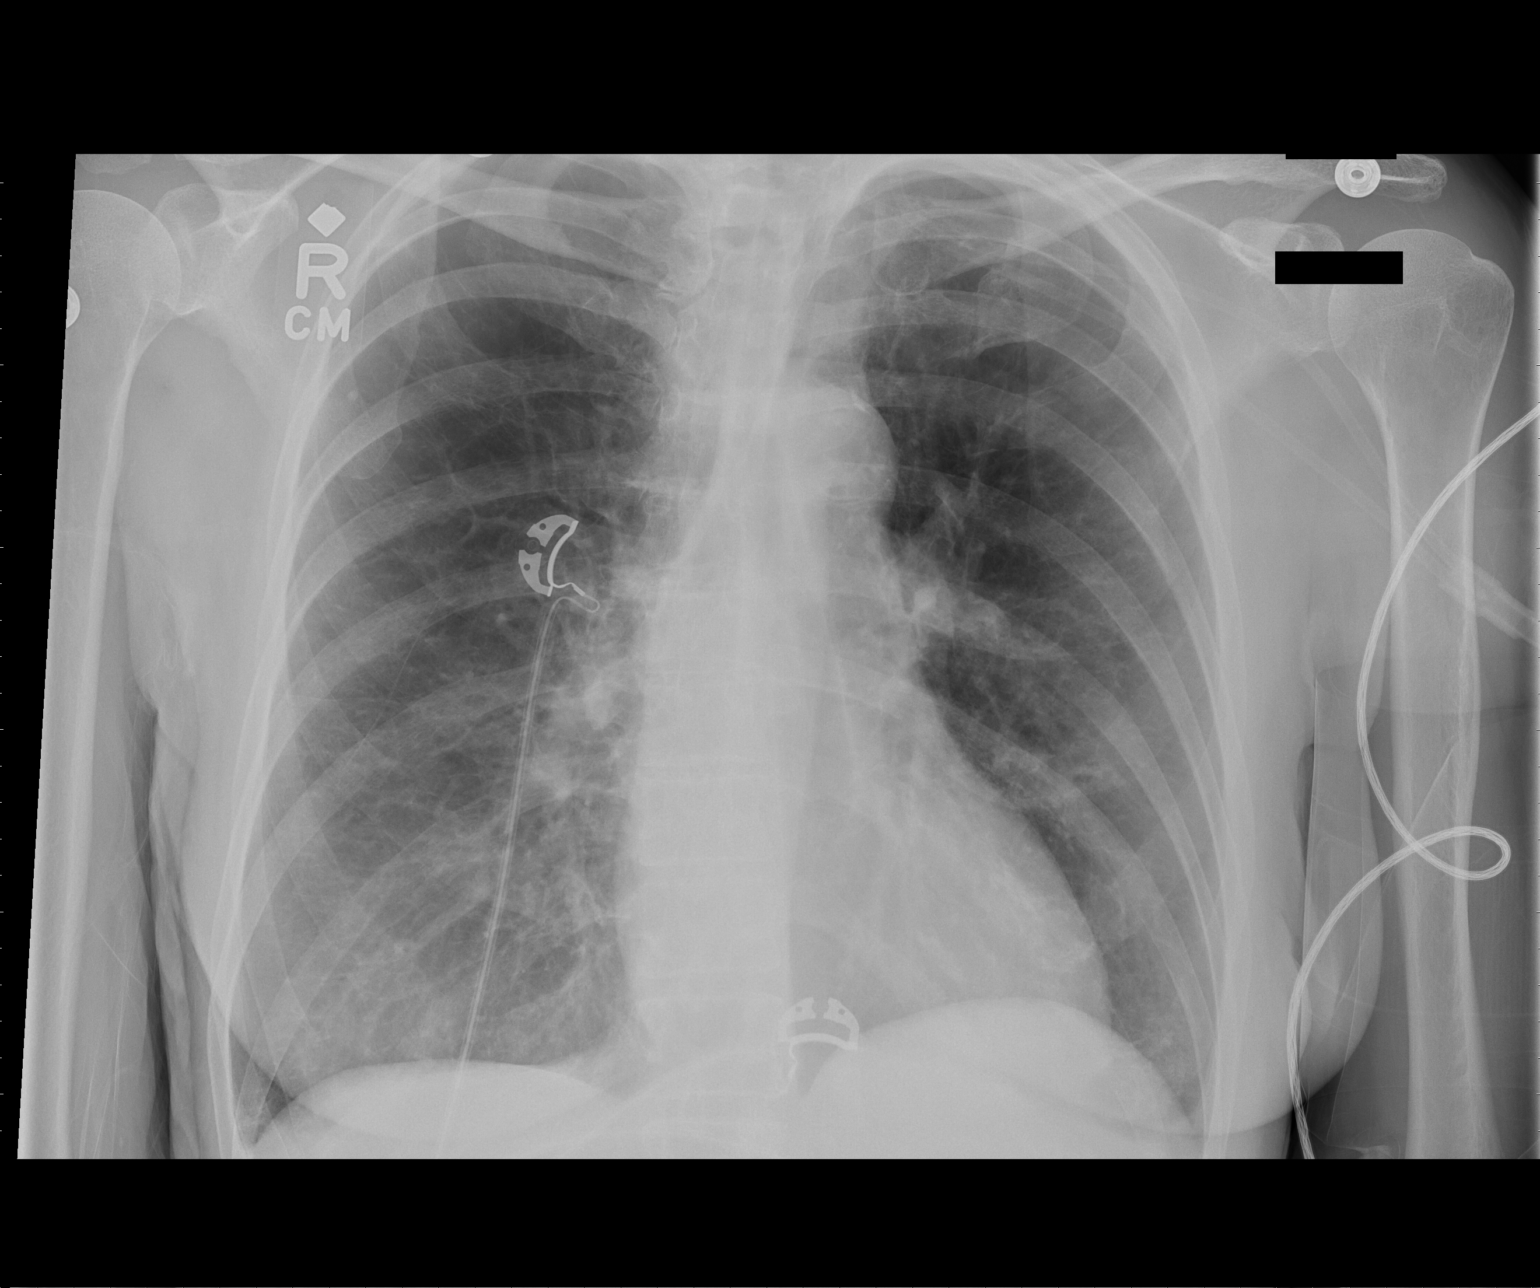

[1 of 1 positions shown; findings below may reference images not displayed]

FINDINGS: Heart size is normal and the vascularity is normal.
COPD.  Negative for pneumonia or effusion.

Endotracheal tube and NG tubes have been removed.
IMPRESSION: COPD.  No acute cardiopulmonary disease.

## 2012-07-31 IMAGING — CR DG CHEST 1V PORT
1 series · 1 of 1 positions shown · non-contrast
Comparison: Portable exam 1416 hours compared to 04/27/2011

CLINICAL DATA: Increased oxygen requirements, weakness, shortness
of breath, history COPD per patient

PORTABLE CHEST - 1 VIEW

[view not recorded]
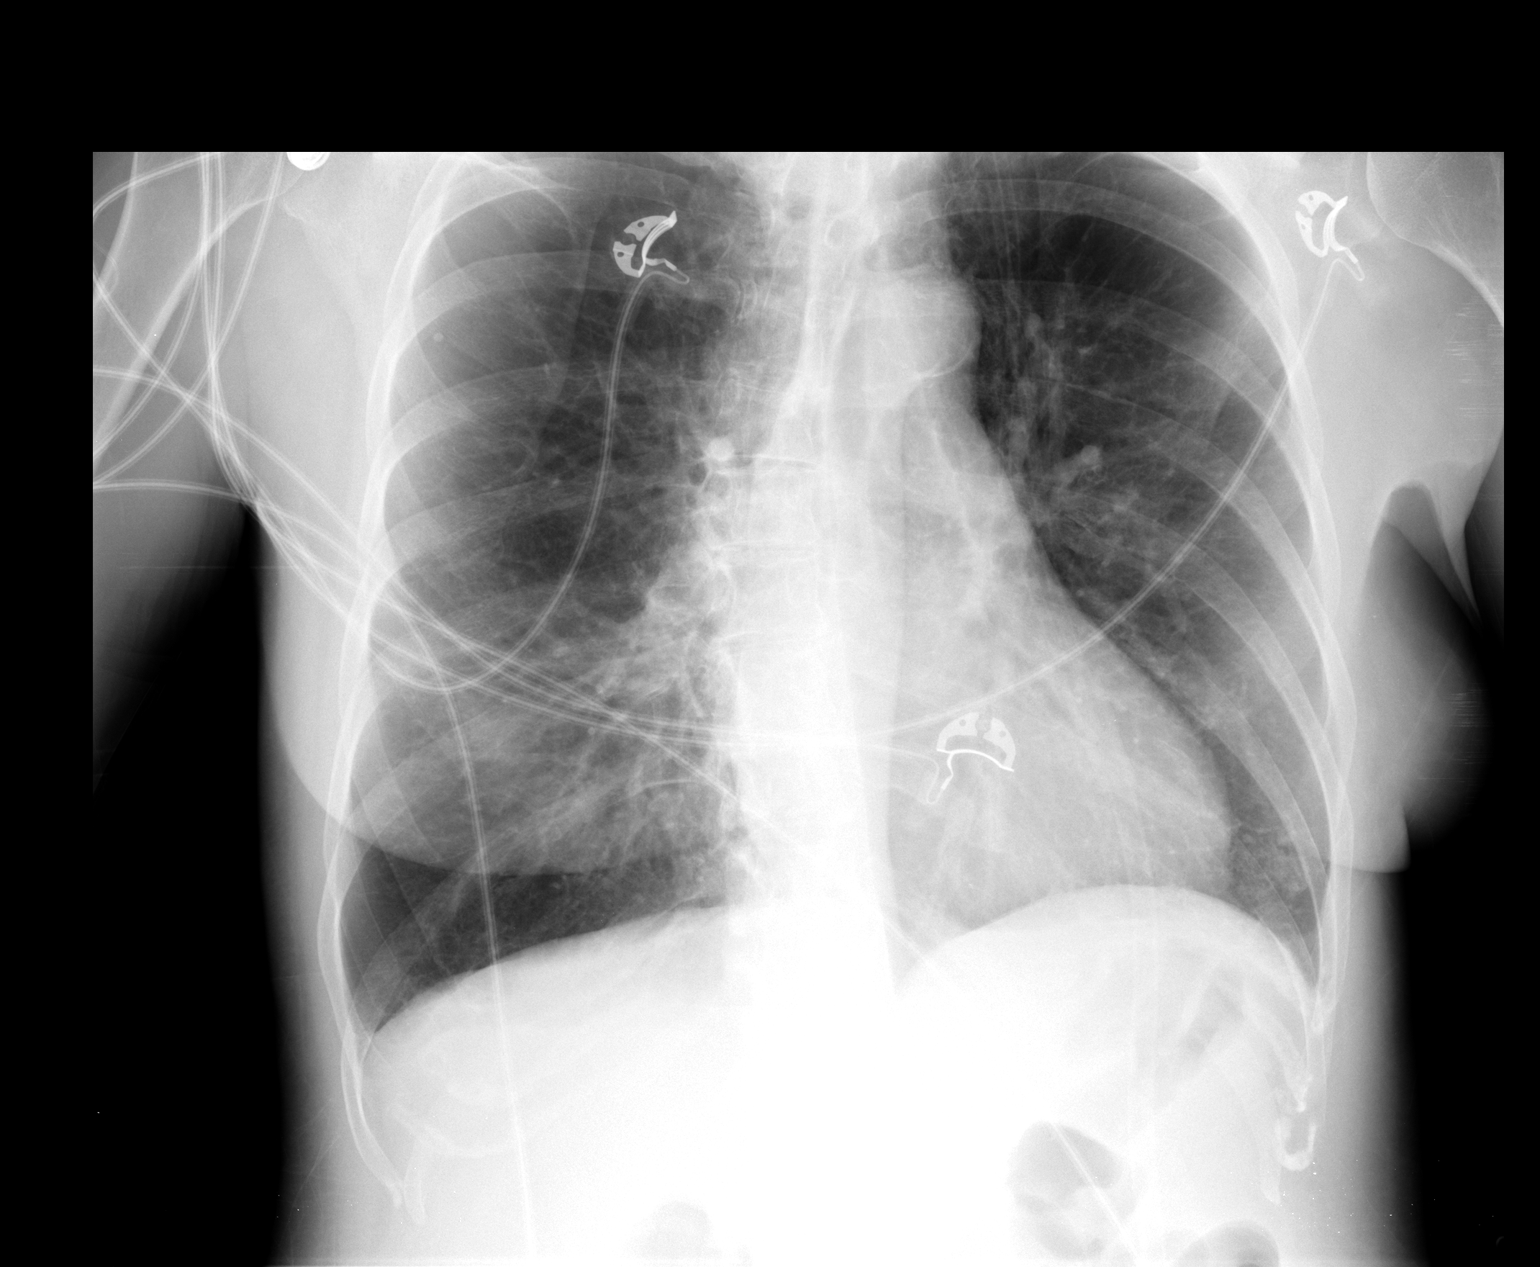

[1 of 1 positions shown; findings below may reference images not displayed]

FINDINGS: Slight rotation to left.
Lordotic positioning.
Normal heart size, mediastinal contours, and pulmonary vascularity.
Atherosclerotic calcification aorta.
Emphysematous and bronchitic changes.
Calcified granuloma right upper lobe.
Minimal atelectasis versus infiltrate left lung base.
Remaining lungs clear.
No pleural effusion or pneumothorax.
IMPRESSION: Emphysematous and bronchitic changes with evidence of old
granulomatous disease.
Minimal atelectasis versus infiltrate at left lung base.

## 2013-06-10 IMAGING — CT CT HEAD WITHOUT CONTRAST
2 series · 16 of 30 positions shown, 20 images · non-contrast
Comparison: none

REASON FOR EXAM: ams weakness
COMMENTS:

PROCEDURE:     CT  - CT HEAD WITHOUT CONTRAST  - March 07, 2012  [DATE]
RESULT:     Technique: Helical 5mm sections were obtained from the skull
base to the vertex without administration of intravenous contrast.

[Series 2: without · axial · non-contrast · 0.43mm/px · z∈[+376,+496]mm · 13 of 30 slices shown, 17 images]
[im 3/30  brain]
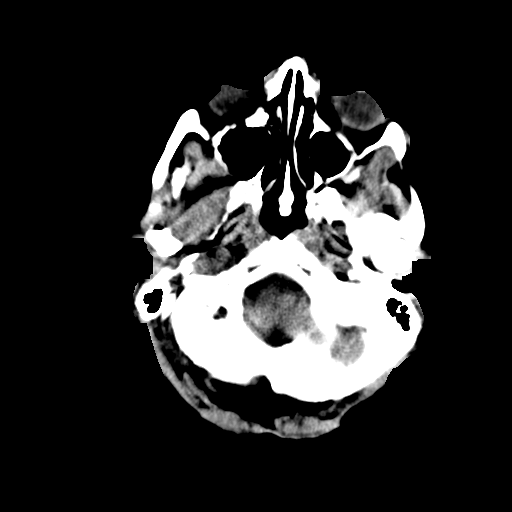
[im 3/30  bone]
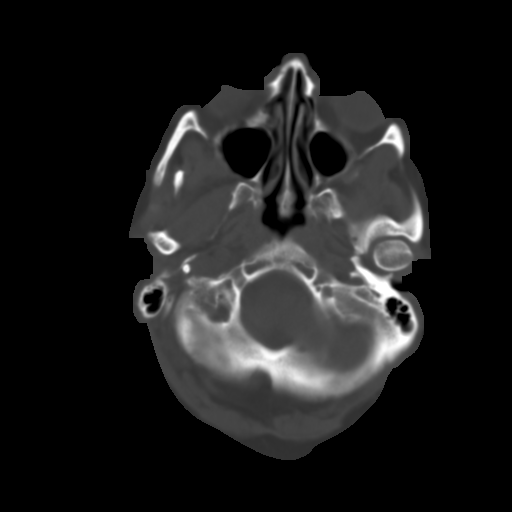
[im 5/30  brain]
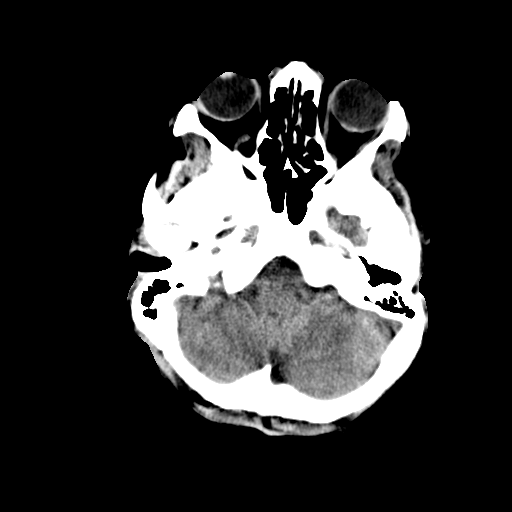
[im 7/30  brain]
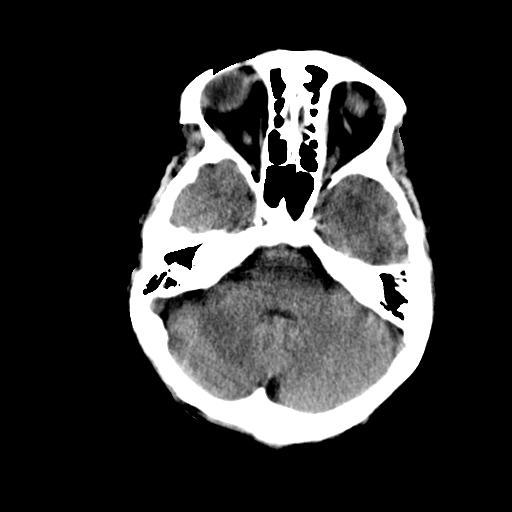
[im 9/30  brain]
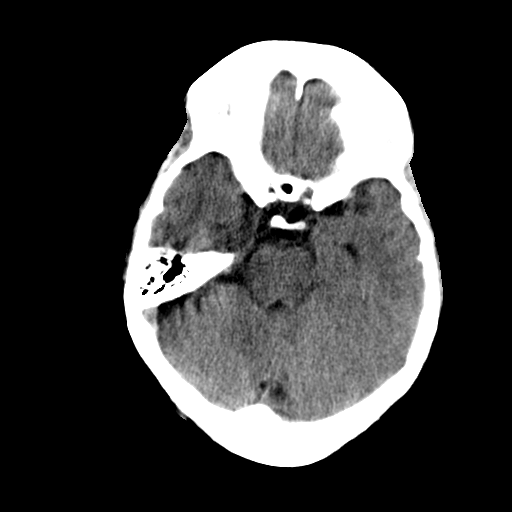
[im 11/30  brain]
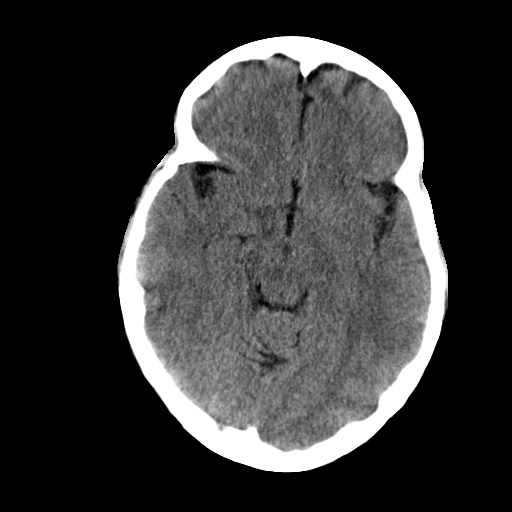
[im 11/30  bone]
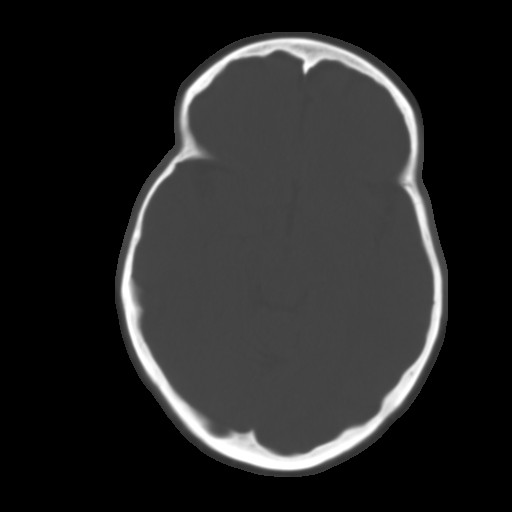
[im 13/30  brain]
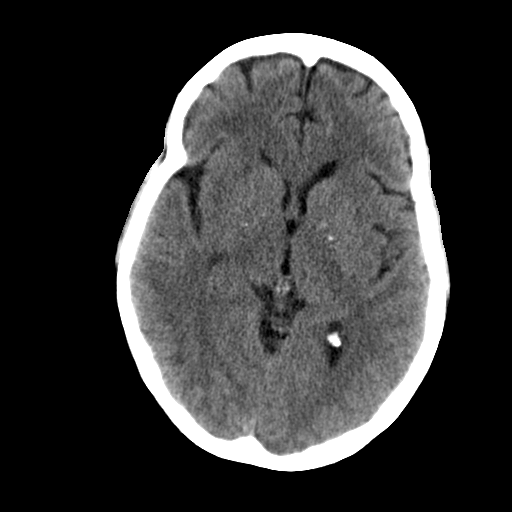
[im 15/30  brain]
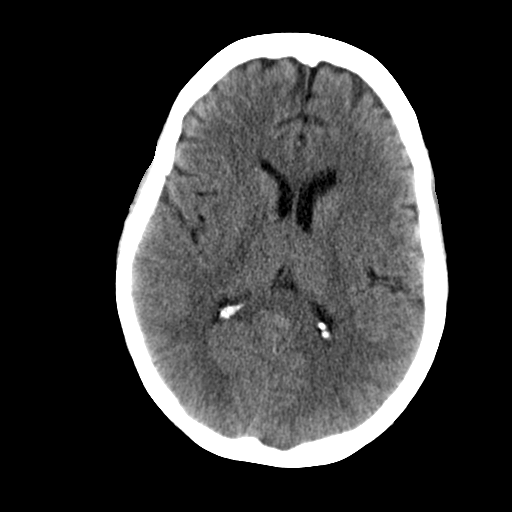
[im 17/30  brain]
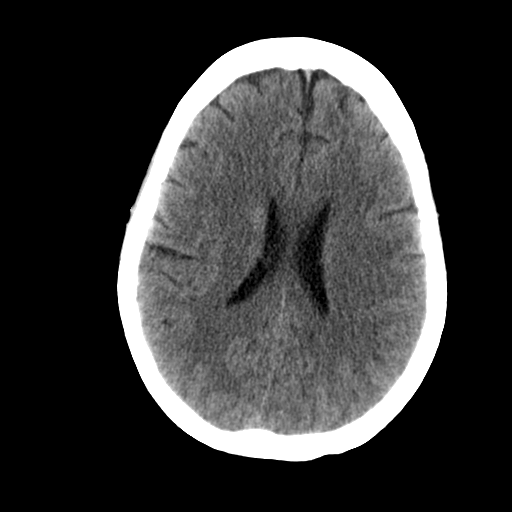
[im 19/30  brain]
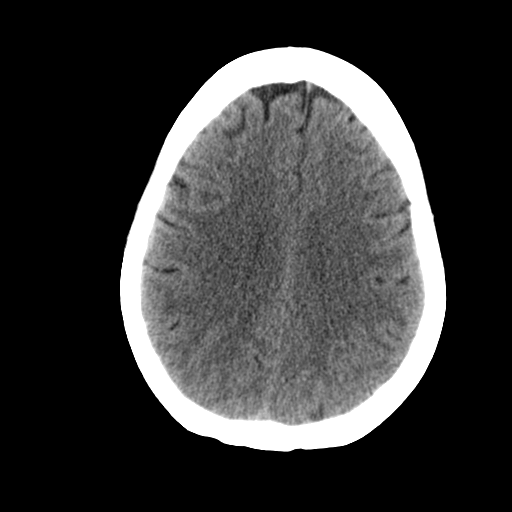
[im 19/30  bone]
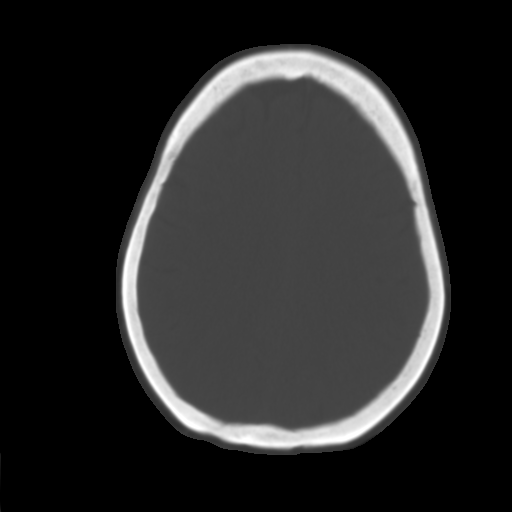
[im 21/30  brain]
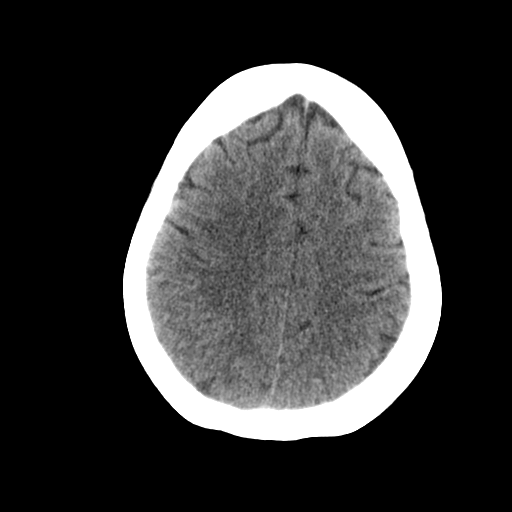
[im 23/30  brain]
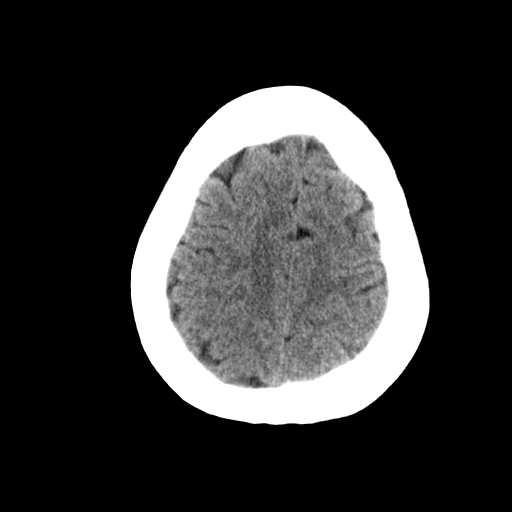
[im 25/30  brain]
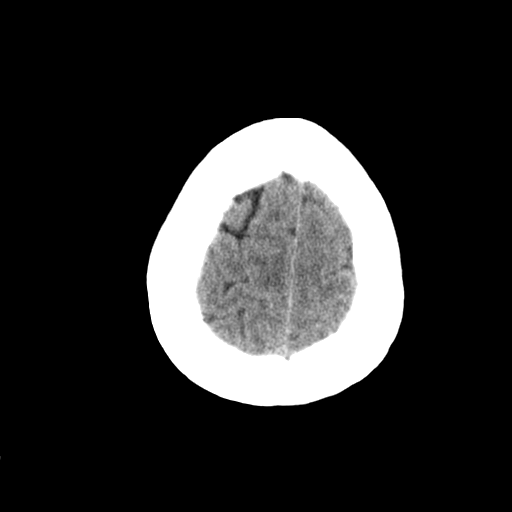
[im 27/30  brain]
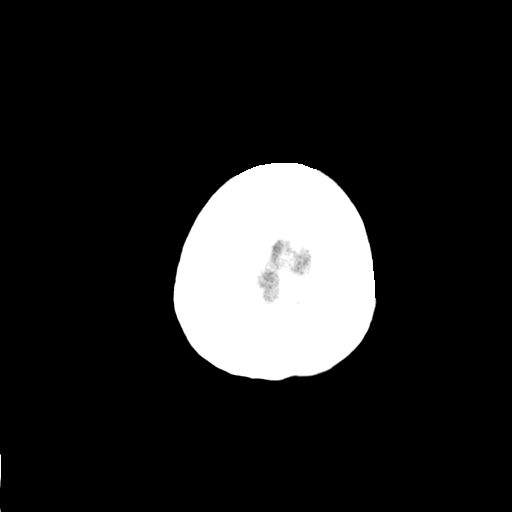
[im 27/30  bone]
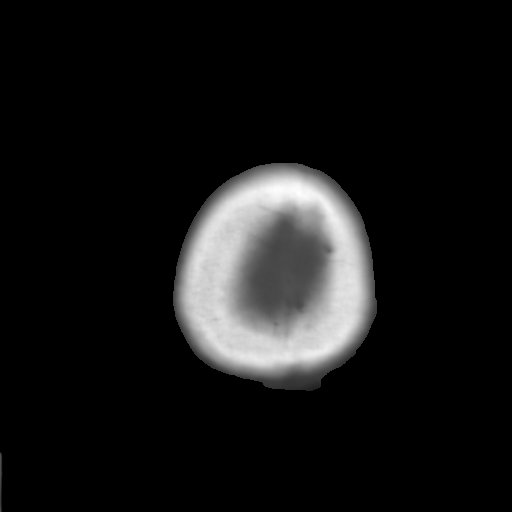

[Series 3: bone · axial · 0.43mm/px · z∈[+376,+416]mm · 3 of 30 slices shown]
[im 3/30  bone]
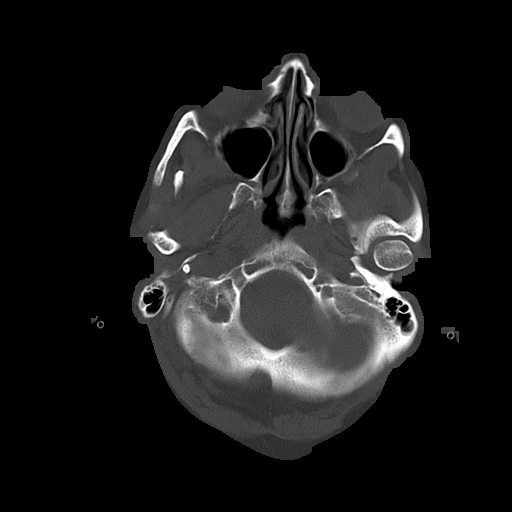
[im 7/30  bone]
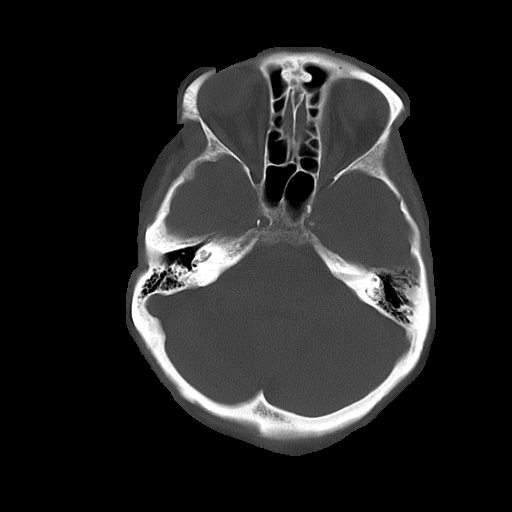
[im 11/30  bone]
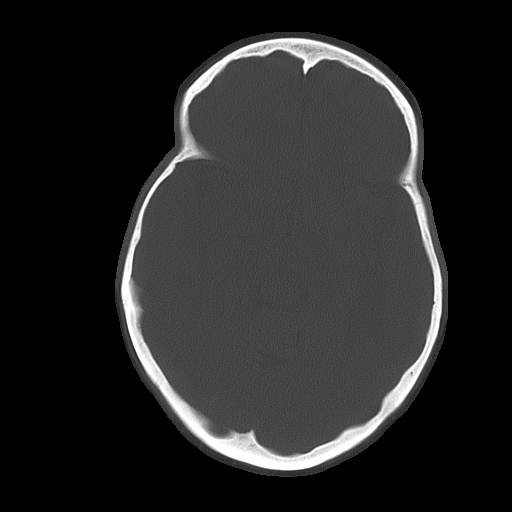

[16 of 30 positions shown; findings below may reference images not displayed]

FINDINGS: There is not evidence of intra-axial fluid collections. There is
no evidence of acute hemorrhage or secondary signs reflecting mass effect or
subacute or chronic focal territorial infarction. The osseous structures
demonstrate no evidence of a depressed skull fracture. If there is
persistent concern clinical follow-up with MRI is recommended.
IMPRESSION: 1. No evidence of acute intracranial abnormalities.
2. Comparison made to prior study dated 04/26/2011

## 2013-06-10 IMAGING — CR DG CHEST 1V PORT
1 series · 1 of 1 positions shown · non-contrast
Comparison: none

REASON FOR EXAM: ams weakness
COMMENTS:

[portable]
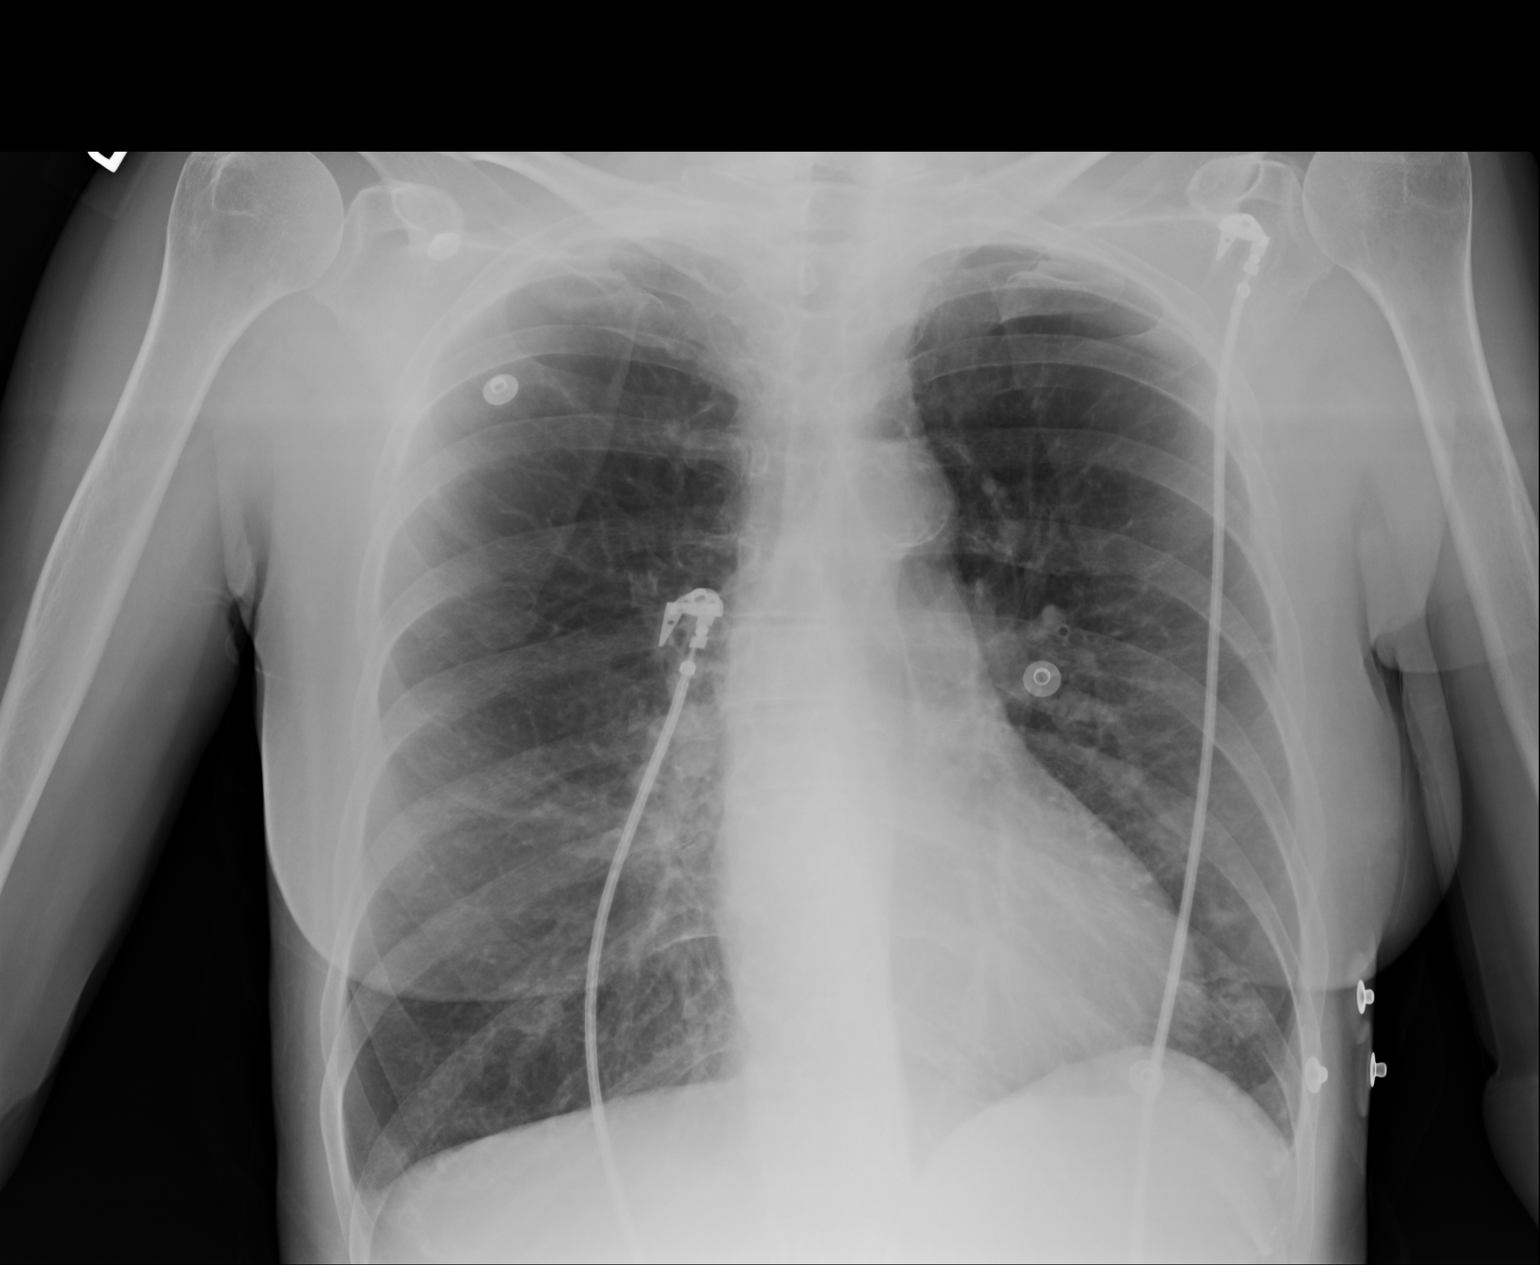

[1 of 1 positions shown; findings below may reference images not displayed]

PROCEDURE:     DXR - DXR PORTABLE CHEST SINGLE VIEW  - March 07, 2012  [DATE]

RESULT:     Comparison is made to prior study dated 01/22/2012.

The lungs are hyperinflated. No focal regions of consolidation or focal
infiltrates are appreciated. The cardiac silhouette and visualized bony
skeleton are unremarkable.
IMPRESSION: 1. COPD without evidence of acute cardiopulmonary disease.

## 2015-01-12 NOTE — Discharge Summary (Signed)
PATIENT NAME:  [name], [name] MR#:  [id number] DATE OF BIRTH:  [birth year]  DATE OF ADMISSION:  06/14/2012 DATE OF DISCHARGE:  06/18/2012  [facility]   CODE STATUS: NO CODE, DO NOT RESUSCITATE.   PRESENTING COMPLAINT: Shortness of breath.   DISCHARGE DIAGNOSES:  1. 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  2. Acute on chronic hypoxic, hypercarbic respiratory failure secondary to chronic obstructive pulmonary disease exacerbation/emphysema. Patient was intubated on the vent and then terminally extubated per family request.  3. Acute congestive heart failure, systolic, ejection fraction of 25%.   CONSULTATIONS: 1. Palliative care consult with Dr. Harvie JuniorPhifer.  2. Pulmonary consult with Dr. Belia HemanKasa.   BRIEF SUMMARY OF HOSPITAL COURSE: Ms. [name] is a 64 year old Caucasian female with history of end-stage chronic obstructive pulmonary disease, emphysema who came into the emergency room with increasing shortness of breath. She was admitted with:  1. Acute on chronic hypoxic hypercarbic respiratory failure from chronic obstructive pulmonary disease. Her CO2 on arrival was 114. She was tried on BiPAP noninvasive ventilation. She did not tolerate its well, continued to decline; hence got intubated in the emergency room. She was transferred to the ICU, was continued on IV high-dose Solu-Medrol and antibiotics. The patient was a NO CODE, DO NOT RESUSCITATE during her last admission. Patient's son, Dickie LaBrody, is [employer] [facility] [employer] PO[facility] who discussed with Dr. Harvie JuniorPhifer, and since the patient did not want to be intubated family decided on terminal extubation. The patient got extubated and moved to the floor; however, she continued to be tachypneic and using accessory muscles. She was started on morphine PCA pump, and after discussing with family members a decision was made [facility]. Family was agreeable with it.  2. Acute on chronic obstructive  pulmonary disease exacerbation. Chest x-ray did not show any obvious pneumonia. However, she was continued on Solu-Medrol high-dose with IV Levaquin with IV antibiotics, nebulizers, and oral inhalers.  3. Elevated BNP with mild increase in troponin, suspect from acute congestive heart failure, systolic, with ejection fraction of 25% per echo in the setting of respiratory failure.  4. Long-term prognosis is poor.  5. Hospital stay otherwise remained complicated.   CODE STATUS: PATIENT IS A NO CODE, DO NOT RESUSCITATE.   TIME SPENT: 40 minutes.    ____________________________ Wylie HailSona A. Allena KatzPatel, MD sap:vtd D: 06/19/2012 07:09:07 ET T: 06/20/2012 11:17:07 ET JOB#: [id number]  cc: Parthena Fergeson A. Allena KatzPatel, MD, <Dictator> Willow OraSONA A Raylinn Kosar MD ELECTRONICALLY SIGNED 07/03/2012 9:28

## 2015-01-12 NOTE — H&P (Signed)
PATIENT NAME:  [name], [name] MR#:  [id number] DATE OF BIRTH:  10/15/1950  DATE OF ADMISSION:  06/14/2012  REFERRING PHYSICIAN: Dr. Jene Every.   PRIMARY CARE PHYSICIAN: [facility] [place].   CHIEF COMPLAINT: Shortness of breath and intubated.   HISTORY OF PRESENT ILLNESS: The patient is a 64 year old Caucasian female with end-stage emphysema per chart on home oxygen, chronic respiratory failure, previously was on Hospice but last discharged from the [place] several months ago.  The Hospice was not resumed for unknown reason. She is currently intubated on the vent, sedated. The patient's mother is in the room. Apparently the patient lives with her mother, and for the last several weeks she has been having progressive shortness of breath, labored breathing, wheezing, without any cough or fevers. The patient cannot provide any history, given she is intubated. Mother denied her having any fevers or productive cough. Here she has a negative x-ray of the chest for pneumonia, and she had no white count. She was started on BiPAP initially and as she did not improve significantly decision was made to intubate her. The patient per chart, previously was DO NOT RESUSCITATE and under Hospice care however. ABG showed pH of 7.22 and pCO2 of 115, and her next ABG about an hour and a half or so later shows pH of 7.55, pCO2 of 44, and pO2 of 340. Hospitalist service was contacted for further evaluation and management.   PAST MEDICAL HISTORY: Per chart:  1. End-stage emphysema. 2. Chronic obstructive pulmonary disease on home oxygen. 3. Hypertension but not on any medication. 4. Anxiety.  5. Osteoarthritis.  6. Chronic pain. 7. Coronary artery disease, status post myocardial infarction. 8. Hyperlipidemia. 9. Recurrent bronchitis. 10. Steroid-induced hyperglycemia.   SOCIAL HISTORY: Previous smoker. No smoking now. No alcohol or drug use.   FAMILY HISTORY: Positive for coronary artery disease and diabetes and hypertension  per chart.  REVIEW OF SYSTEMS: Unable to obtain given patient is intubated.    MEDICATIONS: We do not have a list of her medications as of yet, and pharmacy tech is in the process of obtaining it.   PHYSICAL EXAMINATION:   VITAL SIGNS: Temperature on arrival 98.6, pulse 100, respiratory rate 48, blood pressure 114/71.  Last blood pressure 151/74, respiratory rate 18, O2 saturations on arrival 94% on oxygen.   GENERAL: The patient is a critically ill-appearing, thin Caucasian female on the vent lying in bed.   HEENT: Normocephalic, atraumatic. Pupils are equal and reactive. Anicteric sclerae. The patient is orally intubated. Does open eyes spontaneously and attempts to communicate while I examined her.   NECK: Supple. No JVD. No thyroid tenderness.   CARDIOVASCULAR: S1, S2 regular rate and rhythm. No murmurs, rubs, or gallops.   LUNGS: Decreased breath sounds bilaterally with some coarse breath sounds bilaterally.   ABDOMEN: Soft, nondistended. Positive bowel sounds all quadrants.   EXTREMITIES: No significant lower extremity edema.   NEUROLOGIC: Unable to do a neuro exam; however, patient does open eyes spontaneously, shakes her head somewhat, and attempts to verbally communicate.   SKIN: No obvious rashes.  PSYCHIATRIC: The patient is intubated on Versed and unable to fully assess her psychiatric status.   LABORATORIES: BNP elevated at 17,144. Glucose 171, BUN 31, creatinine 0.82, sodium 139, potassium 4.7. Troponin 0.16. Albumin 3.3, bilirubin 0.4, alkaline phosphatase 125, AST 54, ALT 45. WBC 5.2, hemoglobin 10.2, hematocrit 30, and platelets are 234,000. Initial ABG: A pH 7.22, pCO2 of 115, pO2 of 82 on the BiPAP. On the vent,  pH 7.55, pCO2 of 44, pO2 of 340, FiO2 of 70%. X-ray of the chest before intubation showing rotation of the patient towards the left with ill-defined area of mid lung disease, could represent perihilar infiltrate. Repeat x-ray of the chest postintubation  showing lungs show improved aeration, no definitive infiltrate. EKG: Sinus tachycardia, rate is 103, ST and T wave abnormalities inferior leads, no acute ST elevations or depression.   ASSESSMENT AND PLAN: We have a 64 year old Caucasian female with end-stage emphysema and oxygen dependent with chronic respiratory failure, coronary artery disease status post myocardial infarction, hyperlipidemia, recurrent bronchitis who has been intubated in the past and previously was on Hospice and DO NOT RESUSCITATE per our records here who came in with progressive shortness of breath and labored breathing, not better with BiPAP and currently is intubated, on Versed for sedation. The patient is critically ill and at high risk of cardiopulmonary arrest. Respiratory failure is acute on chronic, chronic obstructive pulmonary disease exacerbation as she initially had significant CO2 retention with pCO2 of greater than 110, which has improved with BiPAP and currently is intubated. Would start her on Levaquin IV, steroids high-dose  q.6 hours and obtain a pulmonary consult. Would check another ABG now as her ABG shows overcorrection of the CO2, and I would adjust the vent appropriately. She has no fever or leukocytosis to indicate she is having a pneumonia.   Would obtain a palliative care consult for code status. The mother is in the room now, and she does not know her current code status. I attempted to discuss the case with the patient's son;  however, I got disconnected and was not successful in reconnecting with the son.   The patient also does have elevated troponin. This is likely in the setting of progressive respiratory failure, and I would cycle the troponins, give an aspirin, and check a lipid profile, but would not give her beta blocker given acute bronchospastic disease. I will start her on deep vein thrombosis prophylaxis with Lovenox.   TOTAL TIME SPENT: 50 minutes.   ____________________________ Krystal EatonShayiq  Maxx Pham, MD sa:vtd D: 06/14/2012 20:47:03 ET   T: 06/15/2012 08:09:34 ET  JOB#: [id number] cc: Krystal EatonShayiq Mesa Janus, MD, <Dictator> Krystal EatonSHAYIQ Shonica Weier MD ELECTRONICALLY SIGNED 06/28/2012 15:13

## 2015-01-12 NOTE — Consult Note (Signed)
Comments   I met with pt's son, Olevia Perches, pt's mother and sister, Arbie Cookey, was on speaker phone. Updated them on pt's current condition. Son confirms that pt never wanted to be intubated. Family says that pt's quality of life had become very poor and they understand that she may not survive this illness. They want to respect pt's wishes and proceed with extubtion. Orders entered.   Electronic Signatures: Yacqub Baston, Izora Gala (MD)  (Signed 23-Sep-13 09:30)  Authored: Palliative Care   Last Updated: 23-Sep-13 09:30 by Dakisha Schoof, Izora Gala (MD)

## 2015-01-17 NOTE — H&P (Signed)
PATIENT NAME:  [name], [name] MR#:  [id number] DATE OF BIRTH:  1950-12-02  DATE OF ADMISSION:  11/24/2011  ADMITTING PHYSICIAN:  Dr. Gladstone Lighter PRIMARY CARE PHYSICIAN:  [facility]    CHIEF COMPLAINT: Difficulty breathing.   HISTORY OF PRESENT ILLNESS: [name] is a 64 year old female with a past medical history significant for chronic respiratory failure secondary to chronic obstructive pulmonary disease on 2 liters home oxygen, baseline dyspnea on exertion, hypertension, and coronary artery disease who presented to the hospital complaining of worsening dyspnea even at rest since yesterday. The patient says it is common for her to have chronic obstructive pulmonary disease exacerbation whenever the season changes. Last hospitalization for chronic obstructive pulmonary disease was in 01/2011.   She said was fine up until yesterday. She started to have nasal congestion, cough, and then difficulty breathing even at rest. She did not sleep well even in a sitting up position last night and could not take it anymore. She came to the Emergency Room. In the Emergency Room she still sounds tight in spite of receiving Solu-Medrol and three rounds of nebulizer treatments. She is at baseline on 2 liters of home oxygen and is being admitted for chronic obstructive pulmonary disease exacerbation.  PAST MEDICAL HISTORY:  1. Hypertension.  2. Coronary artery disease, status post stent.  3. Chronic respiratory failure secondary to chronic obstructive pulmonary disease on 2 liters home oxygen.   PAST SURGICAL HISTORY:  1. Cesarean section.  2. Surgery for a blood clot in the iliac artery.   ALLERGIES: Allergic to sulfa medications.   MEDICATIONS: She states she is on: 1. Combivent inhaler.  2. Symbicort.  3. Antihistamine for allergies.  4. Benadryl 25 mg p.o. at bedtime for sleep.  5. Aspirin 81 mg p.o. daily.  6. Rosuvastatin 20 mg p.o. daily.  7. HCTZ triamterene 25/37.5 mg p.o.  daily.   SOCIAL HISTORY: Lives at home with her mother. Quit smoking about 3 to 4 years ago. She  used to smoke 1 pack per day prior to that. No alcohol use.   FAMILY HISTORY: Mother with hypertension, still alive. Father died from renal cell cancer and also had lung cancer history. Grandmother with multiple myeloma.     REVIEW OF SYSTEMS:  CONSTITUTIONAL: No fever. Positive for weight loss of about 20 pounds in the last month. EYES: No blurred vision, double vision, glaucoma, or cataracts. ENT: No tinnitus, ear pain, epistaxis or discharge. RESPIRATORY: Positive for dyspnea. No chest pain. Positive for cough and also wheezing. CARDIOVASCULAR: No chest pain, orthopnea, edema, arrhythmia, palpitations, or syncope. GI: No nausea, vomiting, abdominal pain, hematemesis, or melena. GU: No dysuria, hematuria, renal calculus, frequency, or incontinence. ENDOCRINE: No polyuria, nocturia, thyroid problems, heat or cold intolerance. HEMATOLOGY: No anemia, easy bruising or bleeding. SKIN: No acne, rash, or lesions. Positive for dry skin. NEUROLOGIC: No numbness, weakness, cerebrovascular accident, transient ischemic attack, or seizures. PSYCHOLOGIC: No anxiety, insomnia, or depression.   PHYSICAL EXAMINATION:  VITAL SIGNS: Temperature 98 degrees Fahrenheit.  Pulse120, respirations 34, blood pressure 218/117, pulse oximetry 98% on 2 liters oxygen.   GENERAL:  Thin built, well nourished female sitting in bed in mild respiratory distress.   HEENT: Normocephalic, atraumatic. Pupils equal, round, reacting to light. Anicteric sclerae. Extraocular movements intact. Oropharynx clear without erythema, mass, or exudates.   NECK: Supple. No thyromegaly, jugular venous distention, or carotid bruits. No lymphadenopathy.   LUNGS: Very scant breath sounds bilaterally. Minimal use of accessory muscles especially on  talking and on exertion. No crackles. Mild expiratory wheezing.   ABDOMEN: Soft, nontender, nondistended. No  hepatosplenomegaly. Normal bowel sounds.   CARDIOVASCULAR: S1, S2. Regular rate and rhythm. No murmurs, rubs, or gallops.   EXTREMITIES: No pedal edema. No clubbing or cyanosis. 2+ dorsalis pedis pulses palpable bilaterally.   SKIN: No acne, rash, or lesions but very dry skin, like ichthyosis and flaky epidermis present all over.   NEUROLOGIC: Cranial nerves intact. No focal motor or sensory deficits.   PSYCH: The patient is awake, alert, oriented times three.   LABORATORY, DIAGNOSTIC, AND RADIOLOGICAL DATA: WBC 8.9, hemoglobin 14.9, hematocrit 45.4, platelet count 135.   Sodium 139, potassium 2.9, chloride 91, bicarbonate 35, BUN 12, creatinine 0.8, glucose 145, calcium 9.6.   ALT 15, AST 27, alkaline phosphatase 68, total bilirubin 0.9, albumin 4.3. Troponin is negative. BNP is elevated at 1428. CK, CK-MB negative. ABG showing pH of 7.48,  pCO2 50, pO2 of 84, bicarbonate 37, sats 97% on 4 liters. Chest x-ray shows hyperinflated lung fields consistent with chronic obstructive pulmonary disease. No evidence of any edema or effusion or pneumonia seen. Thickening of lung markings consistent with pulmonary fibrotic changes is present. EKG shows sinus tachycardia, heart rate of 101.   ASSESSMENT AND PLAN: This is a 64 year old female with chronic respiratory failure on 2 liters home oxygen secondary to chronic obstructive pulmonary disease, hypertension, and coronary artery disease admitted for acute on chronic respiratory failure secondary to chronic obstructive pulmonary disease exacerbation.  1. Acute on chronic respiratory failure secondary to chronic obstructive pulmonary disease exacerbation with mild bronchitis.  Start Solu-Medrol 80 mg IV every eight hours. Spiriva and DuoNeb treatments, Symbicort, and azithromycin.  She is on baseline 2 liters home oxygen. Continue oxygen support as needed and wean as tolerated.  2. Hypokalemia: IV replacement and then oral daily and recheck in a.m.  especially as she is on triamterene. 3. Malignant hypertension: Elevated secondary to anxiety and distress when she presented. Currently blood pressure is stable. Continue her triamterene and HCTZ.  4. Coronary artery disease:  Aspirin. No beta blocker secondary to chronic obstructive pulmonary disease. Appears stable. Troponin is negative.  5. Deep vein thrombosis prophylaxis: Subcutaneous heparin.  6. CODE STATUS: FULL CODE.  TIME SPENT ON ADMISSION: 50 minutes.     ____________________________ Gladstone Lighter, MD rk:bjt D: 11/24/2011 13:46:49 ET T: 11/24/2011 14:25:20 ET JOB#: [id number]  cc: Gladstone Lighter, MD, <Dictator> Gladstone Lighter MD ELECTRONICALLY SIGNED 11/27/2011 13:52

## 2015-01-17 NOTE — Discharge Summary (Signed)
PATIENT NAME:  [name], [name] MR#:  [id number] DATE OF BIRTH:  [birth year]  DATE OF ADMISSION:  03/07/2012 DATE OF DISCHARGE:  03/08/2012   ADMITTING DIAGNOSIS: Decrease in responsiveness, hypotension.   DISCHARGE DIAGNOSES:  1. Acute encephalopathy, likely multifactorial including due to medications. She is on multiple sedating medications including OxyContin and oxycodone, some anxiolytics as well as sleep aid which all could have contributed to her current symptoms. She also has electrolyte imbalances that are also likely cause for acute metabolic encephalopathy.  2. Acute renal failure. Her renal failure is acute. She had had normal creatinine on her previous admission a few months prior. It's likely due to volume depletion. With IV fluids her renal functions improved.  3. Hypotension likely due to again medications as well as poor p.o. intake. Blood pressure is still a little low. She is continued on IV fluids. She was on prednisone taper so she has been resumed on higher dose prednisone due to concern for adrenal insufficiency.  4. Hyponatremia due to dehydration, now improved with IV hydration. 5. Leukocytosis, possibly due to stress reaction. Urinalysis showed 3+ bacteria. However, urine culture negative. Chest x-ray was negative. The patient has been afebrile here.  6. Elevated troponin with history of coronary artery disease, could be just demand ischemia, also could be due to ischemia. EKG shows no acute abnormality. The patient will need follow-up cardiac enzymes as well as possible echocardiogram and Cardiology evaluation if felt appropriate.  7. Slightly elevated ammonia of unclear etiology, not the cause for her encephalopathy.  8. Chronic respiratory failure due to chronic obstructive pulmonary disease without any acute exacerbation.  9. History of hypertension but not on any treatment.  10. Anxiety.  11. Chronic pain syndrome.  12. Osteoarthritis.  13. Coronary artery disease,  status post MI. 14. Hyperlipidemia.  15. History of recurrent bronchitis.  16. Previous history of steroid-induced hyperglycemia.   CONSULTANT: Nephrology.   PERTINENT LABS/EVALUATIONS: Urinalysis was negative. pH 7.29, pCO2 56, pO2 69, bicarb 26.9. Ammonia level 43. Lactic acid 0.5. CT scan of the head was negative. WBC 16.1, hemoglobin 12.1, platelet count 173, glucose 110, BUN 53, creatinine 5.57, sodium 130, potassium 4.1, chloride 91, bicarb 28. Troponin was initially 0.17. EKG showed normal sinus rhythm without any ST-T wave changes. Subsequent cardiac enzymes 0.70 and then 0.38. TUDS positive for benzos. WBC count this morning 12.1, hemoglobin 11.2. Her creatinine today is 3.78. Sodium is 135. Potassium 4.1.   HOSPITAL COURSE: Please refer to history and physical done by the admitting physician. The patient is a 64 year old Caucasian female with history of hypertension, COPD on chronic home oxygen, and hyperlipidemia who presented to the ED with decrease in responsiveness and hypotension. The patient was thought to be very dehydrated. She was also on chronic medications that are sedating. It was felt that it was a combination of volume depletion and multiple sedating medications. Her pCO2 was a little high as well likely contributing to her current symptoms. CT scan of the head is normal. She continues to be a little confused and slow.   Acute renal failure likely due to dehydration. Her renal function in the past has been negative.   Hypotension, again likely due to volume depletion. She was on prednisone taper since her discharge in May. Therefore, she is restarted on higher dose prednisone for possible adrenal insufficiency.   The patient also had abnormal urinalysis, however, the urine culture is negative.   Due to her having [facility] benefits, she is being transferred  to the [place]. She is currently stable for transfer.   CURRENT MEDICATIONS:  1. Tylenol 650 q.6 pain or fever.  2. Alprazolam 1  mg q.4 p.r.n. anxiety.  3. Aspirin 325 mg daily.  4. Heparin 5000 sub-Q q.24. 5. Zofran 4 mg q.4 p.r.n.  6. Protonix 40 daily.  7. Simvastatin 20 at bedtime.  8. Prednisone 40 daily.  9. Temazepam 30 mg at bedtime.   DISCHARGE DISPOSITION: Per [facility].   TIME SPENT: 35 minutes.   ____________________________ Lacie Scotts Allena Katz, MD shp:drc D: 03/08/2012 13:25:24 ET T: 03/08/2012 13:49:39 ET JOB#: [id number]  cc: Joya Willmott H. Allena Katz, MD, <Dictator> Charise Carwin MD ELECTRONICALLY SIGNED 03/18/2012 14:48

## 2015-01-17 NOTE — Consult Note (Signed)
PATIENT NAME:  [name], [name] MR#:  [id number] DATE OF BIRTH:  10-Jul-1951  DATE OF CONSULTATION:  03/08/2012  REFERRING PHYSICIAN: Dr. Shaune PollackQing Chen  CONSULTING PHYSICIAN:  Lennox PippinsMunsoor N. Meah Jiron, MD  REASON FOR CONSULTATION: Acute renal failure.   HISTORY OF PRESENT ILLNESS: The patient is a 64 year old Caucasian female with past medical history of endstage chronic obstructive pulmonary disease, hypertension not currently on medications, osteoarthritis, chronic pain, anxiety, coronary artery disease status post myocardial infarction, hyperlipidemia, recurrent bronchitis, and history of steroid-induced hyperglycemia who presented to [facility] with lethargy and low blood pressure. The patient is a very poor historian and cannot offer details regarding the history of present illness and often perseverates on one detail of the history. Upon presentation to the Emergency Department the patient's blood pressure was found to be 78/58. She is not currently on any antihypertensives. It is unclear as to whether she has had poor p.o. intake at home. She denies ingestion of NSAIDs. We are asked to see the patient for evaluation and management of acute renal failure. The patient's presenting creatinine was 5.57. Her baseline creatinine is 0.75 from 01/28/2012. Creatinine today is down to 3.78 with IV fluid hydration with normal saline at 125 mL per hour. She had a renal ultrasound performed today which showed a right kidney of 10.6 cm and a left kidney of 10.6 cm. There was no evidence for hydronephrosis. The patient also had a urinalysis performed yesterday which showed urine protein 30 mg/dL. No hematuria was noted, however. The patient also had a urine culture, which was negative, and blood cultures times two sets, which were negative. The patient also appears to have some evidence for respiratory acidosis with a pH of 7.29 and pCO2 of 56.   PAST MEDICAL HISTORY:  1. Endstage chronic obstructive  pulmonary disease, on home oxygen followed by Hospice per patient report.  2. Anxiety.  3. History of hypertension, not currently on any medications.  4. Osteoarthritis.  5. Chronic pain syndrome.  6. Coronary artery disease status post myocardial infarction.  7. Steroid-induced hyperglycemia.  8. Recurrent bronchitis.  9. Hyperlipidemia.   ALLERGIES: Sulfa.  CURRENT INPATIENT MEDICATIONS:  1. Normal saline at 125 mL/ history. 2. Tylenol 650 mg p.o. every four hours p.r.n.  3. Xanax  1 mg p.o. every four hours p.r.n.  4. Aspirin 325 mg daily.  5. Heparin 5000 units subcutaneous every 12 hours.  6. Zofran 4 mg IV every four hours p.r.n.  7. Oxycodone 10 mg p.o. every four hours p.r.n.  8. Protonix 40 mg p.o. every 6:00 a.m.  9. Prednisone 5 mg p.o. daily.  10. Zocor 20 mg p.o. at bedtime.  11. Azithromycin 500 mg IV every 24 hours.  12. Prednisone 40 mg daily.  13. Restoril 30 mg p.o. at bedtime.   SOCIAL HISTORY: Unable to obtain directly from the patient. However, it appears that she currently lives with her mother. The patient has a history of longstanding tobacco abuse, but apparently none that is current.  FAMILY HISTORY: Unable to obtain directly from the patient, but it appears that she has family history of coronary artery disease, hypertension, and diabetes mellitus.   REVIEW OF SYSTEMS: Currently unable to obtain from the patient as she is perseverating on a few issues. Mainly she states that she was on some new medications, therefore unable to obtain accurate review of systems.   PHYSICAL EXAMINATION:  VITAL SIGNS: Temperature 97.5, pulse 51, respirations 18, blood pressure 98/60.   GENERAL:  Chronically ill-appearing Caucasian female currently in no acute distress.   HEENT: Normocephalic, atraumatic. Extraocular movements are intact. Pupils equal, round, and reactive to light. No scleral icterus. Conjunctivae are pink. No epistaxis noted. Gross hearing intact. Nasal  cannula in place. Oral mucosa moist.   NECK: Supple without JVD or lymphadenopathy.   LUNGS: Clear to auscultation but have diminished breath sounds bilaterally. The patient is currently with normal respiratory effort.   HEART: S1, S2 noted to be slightly bradycardic. No murmurs or rubs appreciated.   ABDOMEN: Soft, nontender, nondistended. Bowel sounds positive. No rebound or guarding. No gross organomegaly appreciated.   EXTREMITIES: No clubbing, cyanosis, or edema.   NEUROLOGIC: The patient is awake and alert. She is oriented only to self at present. She follows simple commands. Strength is five out of five in both upper and lower extremities.   MUSCULOSKELETAL: No joint redness, swelling, or tenderness appreciated.   SKIN: Warm and dry. No rashes noted.   GU: No suprapubic tenderness is noted at this time.   PSYCHIATRIC: The patient with very poor insight at this time.   LABORATORY DATA: Presenting basic metabolic panel showed sodium 130, potassium 4.1, chloride 91, CO2 28, BUN 53, creatinine 5.57, glucose 130. BMP today shows sodium 135, potassium 4.1, chloride 97, CO2 26, BUN 60, creatinine 3.78, glucose 124. LFTs show total protein 7.2, albumin 2.9, total bilirubin 0.5, alkaline phosphatase 101, AST 37, ALT 12, troponin 0.38. Urine drug screen was positive for benzodiazepines, which the patient is on at home. CBC shows WBC 12.1, hemoglobin 11.2, hematocrit 34, platelets 137, INR 0.9. Blood cultures times two sets are negative. Urine culture is negative. Urinalysis shows urine protein 30 mg/dL. No RBCs were noted. ABG shows pH 7.29, pCO2 56, pO2 69, FiO2 32%. Renal ultrasound shows right kidney 10.6 cm and the left kidney at 10.6 cm. No evidence for hydronephrosis.   IMPRESSION: This is a 64 year old Caucasian female with past medical history of endstage chronic obstructive pulmonary disease on home oxygen, anxiety, osteoarthritis, chronic pain syndrome, coronary artery disease status  post myocardial infarction, hyperlipidemia, recurrent bronchitis, steroid-induced hyperglycemia, history of hypertension, not currently on any antihypertensives, who presented to [facility] with lethargy and hypotension.   PROBLEM LIST:  1. Acute renal failure, suspect prolonged volume depleted state which has progressed to acute tubular necrosis.  2. Hypotension.  3. Hyponatremia.  4. Altered mental status.  5. Endstage chronic obstructive pulmonary disease.   PLAN: The patient's renal function does appear to be improving with IV fluid hydration. There are several signs that suggest volume depletion. The patient was hyponatremic, hypotensive, and has renal function that is improved with IV fluid hydration. The patient had renal ultrasound which was negative for obstruction. I recommend continued IV fluid hydration with normal saline. Would continue to follow renal function as well as serum sodium as well as volume status. There is no acute indication for dialysis at this point in time. We will also check SPEP, UPEP, and ANA, though I doubt underlying lupus, kidney disease, or underlying plasma cell dyscrasia since she had relatively normal renal function recently. It appears that the patient continues to have some alterations in mental status.  I would recommend following her mental status as we improve her metabolic parameters. The patient had recent admission at [facility] and is now with readmission. I would consider placement in a place where Hospice services may be available. I would like to thank Dr. Shaune Pollack for this  kind referral.    ____________________________ Lennox Pippins, MD mnl:bjt D: 03/08/2012 12:19:00 ET T: 03/08/2012 12:39:43 ET JOB#: [id number]  cc: Lennox Pippins, MD, <Dictator> Lennox Pippins MD ELECTRONICALLY SIGNED 04/09/2012 19:32

## 2015-01-17 NOTE — H&P (Signed)
PATIENT NAME:  [name], [name] MR#:  [id number] DATE OF BIRTH:  01/24/1951  DATE OF ADMISSION:  03/07/2012  PRIMARY CARE PHYSICIAN: Nonlocal REFERRING PHYSICIAN: Dr. Enedina FinnerGoli   CHIEF COMPLAINT: Sleepy and low blood pressure today.   HISTORY OF PRESENT ILLNESS: 64 year old Caucasian female with history of hypertension, chronic obstructive pulmonary disease on home oxygen, hyperlipidemia presented to ED with above chief complaint. Patient is alert, awake but has mild confusion. According to her, she feels sleepy and weak today. In addition she feels dizzy and she measured her blood pressure which was 88/55 at home. In addition, patient had shortness of breath and cough but she denies any fever, chills. No headache. No chest pain, palpitation, orthopnea, nocturnal dyspnea. Patient said that she has less urine today. She doesn't  drink much fluid but has a good appetite. Patient was noted to have elevated creatinine at 5.7 and was [facility] but the patient refused so patient will be admitted to the hospital for acute renal failure.   PAST MEDICAL HISTORY:  1. End-stage emphysema, chronic obstructive pulmonary disease on home oxygen, chronic respiratory failure.  2. Hypertension but not on medication,  3. Anxiety. 4. Osteoarthritis. 5. Chronic pain syndrome.  6. Coronary artery disease status post myocardial infarction. 7. Hyperlipidemia. 8. Recurrent bronchitis.  9. Steroid-induced hyperglycemia.   SOCIAL HISTORY: Patient denies any smoking, alcohol drinking or illicit drugs but according to previous document patient has long-standing history of tobacco abuse.   FAMILY HISTORY: Positive for coronary artery disease, diabetes, hypertension.  REVIEW OF SYSTEMS: CONSTITUTIONAL: Patient denies any fever, chills. No headache but has dizziness and generalized weakness. EYES: No double vision, blurred vision. ENT: No epistaxis, postnasal drip or discharge. No dysphagia or slurred  speech. RESPIRATORY: Positive for cough, shortness of breath. No wheezing or hemoptysis. CARDIOVASCULAR: No chest pain, palpitation, orthopnea. No nocturnal dyspnea. No leg edema. GASTROINTESTINAL: No abdominal pain, nausea, vomiting, or diarrhea. No melena, bloody stool. GENITOURINARY: No dysuria, hematuria or incontinence but has less urine. ENDOCRINE: No polyuria, polydipsia. No heat or cold intolerance. HEMATOLOGY: No easy bruising or bleeding. NEUROLOGY: No syncope, loss of consciousness or seizure but has weakness and decreased mental status.   PHYSICAL EXAMINATION:  VITAL SIGNS: Temperature 97.1, blood pressure 132/49, initially blood pressure was 78/58, oxygen saturation 92%, respirations 20.   GENERAL: Patient is alert, awake, oriented but has mild confusion, in no acute distress.   HEENT: Pupils round, equal, reactive to light, accommodation. Moist oral mucosa. Clear oropharynx.  NECK: Supple. No JVD or carotid bruit. No lymphadenopathy. No thyromegaly.    CARDIOVASCULAR: S1, S2 regular rate, rhythm. No murmurs, gallops.   PULMONARY: Bilateral air entry. No wheezing, rales. No use of accessory muscles to breathe.   ABDOMEN: Soft. No distention or tenderness. No organomegaly. Bowel sounds present.   EXTREMITIES: No edema, clubbing, or cyanosis. No calf tenderness. Strong bilateral pedal pulses.   SKIN: No rash or jaundice.   NEUROLOGIC: Alert and oriented x3 but has mild confusion. She could not answer questions properly. No focal deficits. Power 5/5. Sensation intact.   LABORATORY, DIAGNOSTIC AND RADIOLOGICAL DATA: Urinalysis negative. pH 7.29, pCO2 56, pO2 69. Bicarbonate 26.9, ammonia level 43, lactic acid 0.5. CAT scan of head: No evidence of acute intracranial abnormality.   WBC 16.1, hemoglobin 12.1, platelets 173, glucose 110, BUN 53, creatinine 5.57, sodium 130, creatinine 4.1, chloride 91, bicarbonate 28, GFR 8. Troponin 0.17.   EKG showed normal sinus rhythm at 91 beats  per  minute.   IMPRESSION:  1. Altered mental status, possibly due to pain medication, oxycodone, but the patient denies any pain medication intake today.  2. Acute renal failure possibly due to hypotension.  3. Hypotension, unknown etiology, possibly due to pain medication.  4. Hyponatremia.  5. Leukocytosis, possibly due to reaction.  6. Elevated troponin level possibly due to acute renal failure.  7. Elevated ammonia.   8. Chronic obstructive pulmonary disease, end-stage chronic emphysema, chronic respiratory failure.  9. Anemia.   PLAN OF TREATMENT:  1. Patient will be admitted to medical floor. Will continue telemetry monitor, O2 by nasal cannula. We will give IV fluid normal saline at 125 mL/h and follow up BMP, CBC, kidney ultrasound and we will get nephrology consult.  2. We will check a urine toxicology.  3. We will hold oxycodone, only give p.r.n.  4. Since patient has history of coronary artery disease, MI, and has elevated troponin we will follow up troponin level and lipid panel. Will start aspirin and Zocor.  5. For chronic obstructive pulmonary disease and emphysema, chronic respirator failure, we will start DuoNebs p.r.n. and continue prednisone.  6. GI and deep vein thrombosis prophylaxis.   Discussed the patient's situation with patient.   TIME SPENT: About 60 minutes.  ____________________________ Shaune Pollack, MD qc:cms D: 03/07/2012 21:54:30 ET T: 03/08/2012 00:26:22 ET  JOB#: [id number] cc: Shaune Pollack, MD, <Dictator> Shaune Pollack MD ELECTRONICALLY SIGNED 03/10/2012 14:26

## 2015-01-17 NOTE — Discharge Summary (Signed)
PATIENT NAME:  [name], [name]#:  [id number] DATE OF BIRTH:  Jan 29, 1951  DATE OF ADMISSION:  01/21/2012 DATE OF DISCHARGE:  01/28/2012  PRESENTING COMPLAINT: Shortness of breath.   DISCHARGE DIAGNOSES:  1. 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  2. Acute bronchitis, completed a course of antibiotic.  3. Hypertension, not on any medications now. Blood pressure well controlled.   CONDITION ON DISCHARGE: Condition on discharge is fair, however, the patient carries a poor long-term prognosis.   DISCHARGE MEDICATIONS:  1. Oxycodone 10 mg every 4 hours as needed.  2. OxyContin SR 10 mg b.i.d.  3. Aspirin 81 mg daily.  4. Docusate 100 mg b.i.d.  5. Crestor 10 mg at bedtime.  6. Temazepam 30 mg at bedtime.  7. Combivent 2 puffs inhaled 4 to 6 times a day as needed.  8. Symbicort 160/4.5 mcg per inhalation, 2 puffs daily.  9. Xanax 1 mg every 4 hours.  10. Prednisone taper.   NOTE: The patient was advised not to take blood pressure medication, HCTZ/triamterene, for now.   DIET: Low sodium diet.   DISPOSITION: Home with Hospice.   DISCHARGE INSTRUCTIONS:  1. Use your oxygen and nebulizer as before.  2. [facility] on your [facility].  CODE STATUS: NO CODE, DO NOT RESUSCITATE.   LABORATORY, DIAGNOSTIC AND RADIOLOGICAL DATA:  Creatinine is 0.98. White count is 15.6, hemoglobin and hematocrit is 10.6 and 31.5, platelet count is 199.  Urine drug screen positive for opiates and benzodiazepines.  Sputum culture consistent with normal flora.  Chest x-ray consistent with chronic obstructive pulmonary disease. The lung fields are clear.  Basic metabolic panel within normal limits.    CONSULTATIONS:  1. Dr. Erin FullingKurian Kasa, Pulmonary. 2. Dr. Harriett SineNancy Phifer, Palliative Care.   HOSPITAL COURSE: [name] is a 64 year old Caucasian female with end-stage emphysema on home oxygen, comes in with:   1. Acute on chronic respiratory  failure from chronic obstructive pulmonary disease flare: She completed a course of antibiotic in the hospital, started on high-dose steroids and was tapered to p.o. prednisone. She will continue 5 mg daily at the end of the taper until seen by [facility]. She was continued on her Spiriva nebulizers and Pulmicort and cough medicine. She still has some expiratory wheezing and decreased air movement, however, overall better and improved since admission. The patient understands her prognosis is overall very poor.  2. Chronic obstructive pulmonary disease exacerbation: Dr. Belia HemanKasa saw the patient while in house.  She was also seen by Palliative Care, Dr. Harvie JuniorPhifer. The patient remained a NO CODE, DO NOT RESUSCITATE. Hospice services at home have  been arranged.  3. Acute bronchitis: She finished a course of antibiotic.  4. Tachycardia: Likely chronic.  5. Hypertension: Stable. Blood pressure, in fact, was a little on the lower side between 98 to 115 systolic, hence, HCTZ/triamterene was discontinued.  6. Hypomagnesemia: Repleted.   The hospital stay overall was prolonged, however remained stable.   CODE STATUS: The patient remained a NO CODE, DO NOT RESUSCITATE.     TIME SPENT: 40 minutes.   ____________________________ Wylie HailSona A. Allena KatzPatel, MD sap:cbb D: 01/29/2012 07:28:06 ET T: 01/29/2012 11:43:55 ET JOB#: [id number]  cc: Emojean Gertz A. Allena KatzPatel, MD, <Dictator> [facility] Willow OraSONA A Eleyna Brugh MD ELECTRONICALLY SIGNED 02/04/2012 13:55

## 2015-01-17 NOTE — Discharge Summary (Signed)
PATIENT NAME:  [name], [name] MR#:  [id number] DATE OF BIRTH:  April 27, 1951  DATE OF ADMISSION:  11/24/2011 DATE OF DISCHARGE:  12/06/2011  PRIMARY CARE PHYSICIAN: [facility]  REASON FOR ADMISSION: Shortness of breath.  DISCHARGE DIAGNOSES: 1. Acute on chronic respiratory failure.  2. Acute chronic obstructive pulmonary disease exacerbation.  3. Acute tracheobronchitis.  4. Leukocytosis, felt to be steroid induced.  5. Hyperglycemia felt to be steroid induced.  6. History of hypertension.  7. History of anxiety. 8. History of coronary artery disease.  9. History of oxygen-dependent chronic obstructive pulmonary disease with chronic respiratory failure.   CONSULT: Pulmonology with Dr. Belia Heman.   DISCHARGE MEDICATIONS:  1. Oxygen at 2 liters per minute via nasal cannula continuously.  2. Prednisone taper as written on prescription. 3. DuoNebs one dose inhaled q.6 hours p.r.n.  4. Albuterol metered-dose inhaler 2 to 4 puffs inhaled q.6 hours p.r.n.  5. Theo-24 200 mg p.o. daily. 6. Symbicort. 160/4.5, 2 puffs b.i.d.  7. Spiriva Handihaler 1 cap inhaled daily.  8. Xanax 1 mg p.o. q.6 hours p.r.n. anxiety.  9. Oxycodone 10 mg p.o. every six hours p.r.n. pain.  10. Aspirin 81 mg daily.  11. HCTZ/triamterene 25 mg/37.5 mg 1 tab p.o. daily.  12. Docusate sodium 100 mg p.o. b.i.d.  13. Rosuvastatin 10 mg p.o. daily. 14. Temazepam 30 mg p.o. at bedtime.   DISCHARGE DISPOSITION: Home.   DISCHARGE CONDITION: Improved, stable.  DISCHARGE ACTIVITY: As tolerated.   DISCHARGE DIET: Low sodium, low fat, low cholesterol.   DISCHARGE INSTRUCTIONS: 1. Take medications as prescribed. 2. Return to Emergency Department for recurrence of symptoms or for worsening shortness of breath, cough, wheezing, fever, chills or for any chest pain.   FOLLOW UP INSTRUCTIONS: Follow up with your primary care physician at the [facility] within 1 to 2 weeks. Patient needs repeat WBC  count in two weeks.   REFERRALS: Patient being referred to Dr. Shirlee Latch of [facility]mon[facility] within 2 to 3 weeks for chronic obstructive pulmonary disease and chronic respiratory failure.   BRIEF HISTORY/HOSPITAL COURSE: Patient is a 64 year old female with past medical history of hypertension, coronary artery disease, anxiety, chronic respiratory failure with oxygen -dependent chronic obstructive pulmonary disease presented to the Emergency Department with complaints of shortness of breath. Please see dictated admission history and physical for pertinent details surrounding the onset of this hospitalization. Additionally, please refer to dictated interim discharge summary performed by Dr. Nemiah Commander from 12/01/2011 for details surrounding the majority of patient's hospital course. This document serves to provide additional details from the time while I was rounding on the patient from 12/02/2011 up until the time of discharge. Please see below.  1. Acute on chronic respiratory failure-Due to acute tracheobronchitis leading to acute chronic obstructive pulmonary disease exacerbation. CT of the chest showed changes suggestive of chronic obstructive pulmonary disease and questionable mild left lower lobe infiltrate. By the time I assumed care for the patient she had completed the 7 day course of antibiotics for tracheobronchitis. She had remained on high flow nasal cannula. She was on IV steroids and also bronchodilator support with nebulizers and MDIs in addition to theophylline and she was also maintained on Symbicort and Spiriva. Each day clinically she did better and she was noted to have better aeration involving her lungs with less wheezing. Eventually her wheezing had resolved. She was seen in consultation by Dr. Belia Heman, pulmonology, who was managing patient's oxygen requirements. She had required several days of  being on high flow nasal cannula and this is slowly weaned per Dr. Clovis FredricksonKasa's recommendations. She  had eventually been successfully weaned off high flow nasal cannula and placed back on supplemental oxygen via nasal cannula. Initially she was on 4 liters and did well. This has further been weaned down to 2 liters nasal cannula which is her continuous home oxygen requirements at baseline at home. Once her condition was noted to have improved she was transitioned off IV steroids onto oral prednisone taper which she will continue/complete as outpatient. She will be referred to outpatient pulmonology for ongoing management of her chronic obstructive pulmonary disease and chronic respiratory failure.  2. Leukocytosis-Felt to be steroid induced as patient's WBC count was normal on admission and she has remained afebrile and has completed antibiotic course for her tracheobronchitis as advised by pulmonology and her WBC count should continue to improve as steroids are being tapered. Recommend repeat WBC count per patient's primary care physician in two weeks which can be performed as an outpatient.  3. Anxiety-Patient is on Restoril at bedtime and we have added some p.r.n. Xanax which has helped her tremendously.  4. Hypertension-Blood pressure well controlled. Patient to continue triamterene/HCTZ.   5. Hyperglycemia-Felt to be steroid induced. Patient does not have any prior history of diabetes mellitus and she was maintained on sliding scale insulin while hospitalized while she was on steroids and her sugars should continue to improve as steroids are being tapered.  6. Coronary artery disease-Patient to continue aspirin. Would avoid beta blockers given chronic obstructive pulmonary disease and risk of bronchospasm and she has denied any chest pain during this hospitalization.  7. On 12/06/2011 patient was hemodynamically stable with significant improvement of her shortness of breath and her breathing is at baseline as were her oxygen requirements and she was felt to be stable for discharge home with close  outpatient follow up which patient was agreeable.   TIME SPENT ON DISCHARGE: Greater than 30 minutes.   ____________________________ Elon AlasKamran N. Blakeleigh Domek, MD knl:cms D: 12/10/2011 12:10:06 ET T: 12/11/2011 13:00:27 ET JOB#: [id number] cc: Elon AlasKamran N. Jowel Waltner, MD, <Dictator> [facility] Laurey Moralealton S. McLean, MD Elon AlasKAMRAN N Annamary Buschman MD ELECTRONICALLY SIGNED 12/17/2011 16:20

## 2015-01-17 NOTE — H&P (Signed)
PATIENT NAME:  [name], [name] MR#:  [id number] DATE OF BIRTH:  13-Feb-1951  DATE OF ADMISSION:  01/21/2012  REFERRING PHYSICIAN: Dr. Jens Som.   FAMILY PHYSICIAN: [place].   REASON FOR ADMISSION: Two week history of worsening shortness of breath with acute on chronic respiratory failure.   HISTORY OF PRESENT ILLNESS: The patient is a 64 year old female with a longstanding history of chronic obstructive pulmonary disease/tobacco abuse and chronic respiratory failure, on oxygen. She was last admitted here approximately 6 to 7 weeks ago for similar symptoms. She presents now with a two week history of worsening shortness of breath, wheezing, and cough. In the Emergency Room, the patient was given IV steroids with multiple SVNs with no improvement of her symptoms. Her hypoxia was worse than her usual baseline and she was requiring more oxygen. She is now admitted for further evaluation.   PAST MEDICAL HISTORY:  1. Chronic respiratory failure, on oxygen.  2. Chronic obstructive pulmonary disease.  3. Recurrent bronchitis.  4. Steroid-induced hyperglycemia.  5. Benign hypertension.  6. Anxiety.  7. Osteoarthritis.  8. Chronic pain syndrome.  9. Atherosclerotic cardiovascular disease, status post myocardial infarction. 10. Hyperlipidemia.   MEDICATIONS:  1. Xanax 1 mg p.o. every four hours p.r.n.  2. Restoril 30 mg p.o. at bedtime.  3. Crestor 10 mg p.o. daily.  4. Symbicort 160/4.5, two puffs b.i.d.  5. Oxycodone 10 mg p.o. every four hours p.r.n. pain.  6. Dyazide 1 p.o. daily.  7. Aspirin 81 mg p.o. daily.  8. Colace 100 mg p.o. b.i.d.  9. Combivent 3 puffs every four hours p.r.n. shortness of breath.   ALLERGIES: Sulfa.   SOCIAL HISTORY: The patient has a long-standing history of tobacco abuse. No history of alcohol abuse.   FAMILY HISTORY: Positive coronary artery disease, diabetes, and hypertension.   REVIEW OF SYSTEMS: CONSTITUTIONAL: No fever or change in weight. EYES: No  blurred or double vision. No glaucoma. ENT: No tinnitus or hearing loss. No nasal discharge or bleeding. No difficulty swallowing. RESPIRATORY: No hemoptysis. No painful respiration. CARDIOVASCULAR: No chest pain or orthopnea. No palpitations or syncope. GASTROINTESTINAL: No nausea, vomiting, or diarrhea. No abdominal pain. No change in bowel habits. GU: No dysuria or hematuria. No incontinence. ENDOCRINE: No polyuria or polydipsia. No heat or cold intolerance. HEMATOLOGIC: The patient denies anemia, easy bruising, or bleeding. LYMPHATIC: No swollen glands. MUSCULOSKELETAL: The patient denies pain in her neck, back, shoulders, knees, or hips. No gout. NEUROLOGIC: No numbness, although she does have generalized weakness. Denies migraines, stroke or seizures. PSYCH: The patient denies anxiety, insomnia, or depression.   PHYSICAL EXAMINATION:  GENERAL: The patient is acutely ill-appearing, in moderate distress.   VITAL SIGNS: Vital signs remarkable for a blood pressure of 175/76 with a heart rate of 97 and a respiratory rate of 22. She is afebrile.   HEENT: Normocephalic, atraumatic. Pupils equally round and reactive to light and accommodation. Extraocular movements are intact. Sclerae are anicteric. Conjunctivae are clear. Oropharynx is dry but clear. NECK: Supple without jugular venous distention. No adenopathy or thyromegaly is noted. LUNGS: Decreased breath sounds with wheezes and rhonchi. No rales. No dullness. CARDIAC: Rapid rate with a regular rhythm. Normal S1 and S2. No significant murmurs. ABDOMEN: Soft, nontender, with normoactive bowel sounds. No organomegaly or masses were appreciated. No hernias or bruits were noted. EXTREMITIES: Without clubbing, cyanosis, or edema. Pulses were 2+ bilaterally. SKIN: Warm and dry without rash or lesions. NEUROLOGIC: Cranial nerves II through XII grossly intact. Deep tendon reflexes  were symmetric. Motor and sensory examination is nonfocal. PSYCH: Exam revealed a  patient who was alert and oriented to person, place, and time. She was cooperative and used good judgment.   LABORATORY, RADIOLOGICAL AND DIAGNOSTIC DATA: Troponin was less than 0.02. Glucose was 115 with a BUN of 12 and a creatinine of 0.74 with a sodium of 136 and a potassium of 3.2. White count was 8.7 with a hemoglobin of 14.8. EKG revealed sinus rhythm with no acute ischemic changes. Chest x-ray revealed no acute disease.   ASSESSMENT:  1. Acute on chronic respiratory failure.  2. Chronic obstructive pulmonary disease exacerbation.  3. Presumed bronchitis.  4. Benign hypertension.  5. Tachycardia.  6. Anxiety.  7. History of steroid-induced hyperglycemia.   PLAN:  1. The patient will be admitted to the floor with IV steroids, IV antibiotics, SVNs, and oxygen.  2. Will continue her Symbicort and add Spiriva.  3. We will send sputum for Gram stain and culture.  4. We will consult pulmonology in regards to her acute on chronic respiratory failure.  5. We will follow serial cardiac enzymes given her cardiac history.  6. We will begin topical nitrates.  7. Will continue her outpatient regimen.  8. We will follow up a chest x-ray and blood work within 36 hours.  9. Will follow her sugars with Accu-Cheks while on steroids.  10. Further treatment and evaluation will depend upon the patient's progress.   TOTAL TIME SPENT ON ADMISSION: 50 minutes.   ____________________________ Duane LopeJeffrey D. Judithann SheenSparks, MD jds:ap D: 01/21/2012 01:31:25 ET T: 01/21/2012 09:55:09 ET JOB#: [id number] cc: Duane LopeJeffrey D. Judithann SheenSparks, MD, <Dictator> [facility] Ibtisam Benge Rodena Medin Wilfrid Hyser MD ELECTRONICALLY SIGNED 01/21/2012 20:15
